# Patient Record
Sex: Male | Born: 1940 | Race: Black or African American | Hispanic: No | Marital: Married | Smoking: Never smoker
Health system: Southern US, Community
[De-identification: ages and names within clinical notes are randomized; demographics above are authoritative.]

## PROBLEM LIST (undated history)

## (undated) DIAGNOSIS — J45909 Unspecified asthma, uncomplicated: Secondary | ICD-10-CM

## (undated) DIAGNOSIS — K219 Gastro-esophageal reflux disease without esophagitis: Secondary | ICD-10-CM

## (undated) DIAGNOSIS — I10 Essential (primary) hypertension: Secondary | ICD-10-CM

## (undated) DIAGNOSIS — N401 Enlarged prostate with lower urinary tract symptoms: Secondary | ICD-10-CM

## (undated) DIAGNOSIS — D649 Anemia, unspecified: Secondary | ICD-10-CM

## (undated) DIAGNOSIS — R338 Other retention of urine: Secondary | ICD-10-CM

---
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
## 2004-06-09 ENCOUNTER — Ambulatory Visit: Payer: Self-pay | Admitting: Internal Medicine

## 2004-06-28 ENCOUNTER — Ambulatory Visit: Payer: Self-pay | Admitting: Internal Medicine

## 2004-07-06 ENCOUNTER — Ambulatory Visit: Payer: Self-pay

## 2004-08-01 ENCOUNTER — Ambulatory Visit: Payer: Self-pay | Admitting: Internal Medicine

## 2004-12-07 ENCOUNTER — Ambulatory Visit: Payer: Self-pay | Admitting: Internal Medicine

## 2004-12-22 ENCOUNTER — Ambulatory Visit: Payer: Self-pay | Admitting: Family Medicine

## 2005-06-07 ENCOUNTER — Ambulatory Visit: Payer: Self-pay | Admitting: Internal Medicine

## 2005-06-28 ENCOUNTER — Ambulatory Visit: Payer: Self-pay | Admitting: Internal Medicine

## 2005-08-23 ENCOUNTER — Ambulatory Visit: Payer: Self-pay | Admitting: Gastroenterology

## 2009-11-23 ENCOUNTER — Ambulatory Visit: Payer: Self-pay | Admitting: Specialist

## 2012-07-14 ENCOUNTER — Emergency Department: Payer: Self-pay | Admitting: Emergency Medicine

## 2012-07-14 LAB — COMPREHENSIVE METABOLIC PANEL
Albumin: 3.2 g/dL — ABNORMAL LOW (ref 3.4–5.0)
Anion Gap: 7 (ref 7–16)
Calcium, Total: 8.1 mg/dL — ABNORMAL LOW (ref 8.5–10.1)
Chloride: 103 mmol/L (ref 98–107)
Co2: 29 mmol/L (ref 21–32)
Glucose: 90 mg/dL (ref 65–99)
SGOT(AST): 41 U/L — ABNORMAL HIGH (ref 15–37)
SGPT (ALT): 65 U/L (ref 12–78)
Total Protein: 6.2 g/dL — ABNORMAL LOW (ref 6.4–8.2)

## 2012-07-14 LAB — CBC
HCT: 42.8 % (ref 40.0–52.0)
MCH: 29.8 pg (ref 26.0–34.0)
MCHC: 33.7 g/dL (ref 32.0–36.0)
MCV: 89 fL (ref 80–100)
RDW: 12.9 % (ref 11.5–14.5)

## 2013-02-09 ENCOUNTER — Emergency Department: Payer: Self-pay | Admitting: Emergency Medicine

## 2013-02-09 LAB — CBC
HCT: 43.6 % (ref 40.0–52.0)
HGB: 14.9 g/dL (ref 13.0–18.0)
MCHC: 34.3 g/dL (ref 32.0–36.0)
MCV: 88 fL (ref 80–100)
Platelet: 101 10*3/uL — ABNORMAL LOW (ref 150–440)
RBC: 4.97 10*6/uL (ref 4.40–5.90)
WBC: 5.5 10*3/uL (ref 3.8–10.6)

## 2013-02-09 LAB — URINALYSIS, COMPLETE
Glucose,UR: NEGATIVE mg/dL (ref 0–75)
Nitrite: NEGATIVE
Protein: 100
RBC,UR: 18 /HPF (ref 0–5)
Squamous Epithelial: 1

## 2013-02-09 LAB — COMPREHENSIVE METABOLIC PANEL
Albumin: 3.8 g/dL (ref 3.4–5.0)
Anion Gap: 7 (ref 7–16)
BUN: 9 mg/dL (ref 7–18)
Bilirubin,Total: 0.7 mg/dL (ref 0.2–1.0)
Calcium, Total: 9.4 mg/dL (ref 8.5–10.1)
Chloride: 95 mmol/L — ABNORMAL LOW (ref 98–107)
Co2: 30 mmol/L (ref 21–32)
Creatinine: 1.14 mg/dL (ref 0.60–1.30)
Glucose: 108 mg/dL — ABNORMAL HIGH (ref 65–99)
Osmolality: 264 (ref 275–301)
SGOT(AST): 27 U/L (ref 15–37)
Sodium: 132 mmol/L — ABNORMAL LOW (ref 136–145)

## 2013-02-09 LAB — TROPONIN I: Troponin-I: <0.02 ng/mL

## 2013-02-09 LAB — CK TOTAL AND CKMB (NOT AT ARMC)
CK, Total: 165 U/L (ref 35–232)
CK-MB: 1.3 ng/mL (ref 0.5–3.6)

## 2014-08-20 NOTE — Consult Note (Signed)
Brief Consult Note: Diagnosis: 1. Pre-syncope 2. UTI 3. Hypokalemia 4. HTN 5. BPH.   Patient was seen by consultant.   Consult note dictated.   Orders entered.   Comments: 74 yo male w/ hx of BPH, HTN, GERD who was recently diagnosed w/ UTI and started on oral Cipro presented to hospital w/ 2 pre-syncope episodes.   1. Pre-syncope - likely related to UTI/dehydration.   - no evidence of Neuro/cardiogenic syncope.  - pt. did not completely pass out. ECG shows no evidence of arrythmia.  ambulated and pt. hr is stable.  - orthostatic vital signs stable.  Will discharge home and encourage hydration.   2. UTI - was on Cipro and will switch to oral Ceftin for a few days  3. Hypokalemia - will place on oral potassium supplements.   4. HtN - cont. home meds.   5. BPH - cont. Avodart.  Pt. was apparently not taking it and advised him to resume it as hx CT scan was suggestive of prostate enlargement.   Pt. has appointment w/ his urologist later this month.   Will d/c home.  Follow up w/ PCP in 1-2 days.   Full Code  job # [id number].  Electronic Signatures: [place] SirenSainani, Vivek J (MD)  (Signed 13-Oct-14 17:00)  Authored: Brief Consult Note   Last Updated: 13-Oct-14 17:00 by [place] SirenSainani, Vivek J (MD)

## 2014-08-20 NOTE — Consult Note (Signed)
PATIENT NAME:  Glen Spencer, Glen Spencer MR#:  [id number] DATE OF BIRTH:  11-08-1940  DATE OF CONSULTATION:  02/09/2013  REFERRING PHYSICIAN: Dorothea GlassmanPaul Malinda, MD. PRIMARY CARE PHYSICIAN: Dr. Terance HartBronstein.  CONSULTING PHYSICIAN:  Ziyon Cedotal J. Cherlynn KaiserSainani, MD  CHIEF COMPLAINT: Presyncope.   HISTORY OF PRESENT ILLNESS: This is a 74 year old male who presents to the hospital with 2 presyncopal episodes that he had earlier today. The patient described the symptoms that he did not feel well, felt lightheaded and dizzy but never really truly lost consciousness. He did not have any chest pain, any shortness of breath, any numbness, tingling, or any other associated symptoms. The patient did say that he was having some blood-tinged urine for the past few days  along with frequency. He went to the urgent care and was diagnosed with a urinary tract infection and started on oral ciprofloxacin. He has been taking it for the past 2 to 3 days.   He thought his symptoms were improving although yesterday he was still having significant frequency. This morning when he woke up, he just did not feel right, felt lightheaded and dizzy  but never lost any consciousness. He therefore came to the ER for further evaluation.   REVIEW OF SYSTEMS:  CONSTITUTIONAL: No documented fever. No weight gain or weight loss.  EYES: No blurry or double vision.  ENT: No tinnitus. No postnasal drip. No redness of the oropharynx.  RESPIRATORY: No cough, no wheeze, hemoptysis, no dyspnea.  CARDIOVASCULAR: No chest pain. No orthopnea. No palpitations. No true syncope.  GASTROINTESTINAL: No nausea. No vomiting. No diarrhea. No abdominal pain. No melena or hematochezia.  GENITOURINARY: No dysuria. Positive hematuria. Positive frequency.  ENDOCRINE: No polyuria or nocturia. No heat or cold intolerance.  HEMATOLOGIC: No anemia, no bruising, no bleeding.  INTEGUMENTARY: No rashes. No lesions.  MUSCULOSKELETAL: No arthritis. No swelling. No gout.  NEUROLOGIC: No  numbness or tingling. No ataxia. No seizure activity.  PSYCHIATRIC: No anxiety. No insomnia. No ADD.   PAST MEDICAL HISTORY: Consistent with hypertension, BPH, GERD.   ALLERGIES: No known drug allergies.   SOCIAL HISTORY: No smoking. No alcohol abuse. No illicit drug abuse. Lives at home with his wife.   FAMILY HISTORY: No significant family history of coronary artery disease. Mother has diabetes. Father died of a cancer of unknown type.   CURRENT MEDICATIONS: As follows: Avodart 0.5 mg daily, hydrochlorothiazide/losartan 25/160, one tab daily, omeprazole 40 mg daily, and simvastatin 40 mg at bedtime.   PHYSICAL EXAMINATION:  VITAL SIGNS: Afebrile. Pulse 74, respirations 20, blood pressure 155/84, sats 98% on room air.  GENERAL: A pleasant-appearing male in no apparent distress.  HEENT: Atraumatic, normocephalic. Extraocular muscles are intact. Pupils equal, reactive to light. Sclerae anicteric. No conjunctival injection. No oropharyngeal erythema.  NECK: Supple. There is no jugular venous distention. No bruits, no lymphadenopathy or thyromegaly.  HEART: Regular rate and rhythm. No murmurs. No rubs, no clicks.  LUNGS: Clear to auscultation bilaterally. No rales. No rhonchi, no wheezes.  ABDOMEN: Soft, flat, nontender, nondistended. Has good bowel sounds. No hepatosplenomegaly appreciated.  EXTREMITIES: No evidence of any cyanosis, clubbing, or peripheral edema. Has +2 pedal and radial pulses bilaterally.  NEUROLOGIC: Alert, awake, and oriented x3 with no focal motor or sensory deficits appreciated bilaterally.  SKIN: Moist and warm with no rashes appreciated.  LYMPHATIC: No cervical or axillary lymphadenopathy.   LABORATORY DATA: Serum glucose of 108, BUN 9, creatinine 1.14, sodium 132, potassium 2.9, chloride 95, bicarb 30. LFTs are within normal limits. Troponin  less than 0.02. White cell count 5.5, hemoglobin 14.9, hematocrit 43.6, platelet count of 101. Urinalysis does show trace  leukocyte esterase, 18 red cells, 32 white cells, with 1+ bacteria.   The patient did have a CT of the head done without contrast which showed no acute intracranial abnormality.   The patient also had a CT abdomen and pelvis done which showed marked enlargement of the prostate gland with irregular impression upon the urinary bladder base. No evidence of hydronephrosis. No hydroureter. Small hiatal hernia.   ASSESSMENT AND PLAN: This is a 74 year old male with a history of benign prostatic hypertrophy, hypertension, gastroesophageal reflux disease, who was recently diagnosed with a urinary tract infection and started on oral Cipro, who presents to the hospital with 2 presyncopal episodes.   1.  Presyncope. I suspect this is probably related to the underlying urinary tract infection and dehydration. The patient had no evidence of acute neurocardiogenic syncope as he never really lost consciousness. His EKG shows no evidence of arrhythmia. The patient was ambulated and his heart rate remained stable with no evidence of bradycardia. His orthostatic vital signs are also stable. For now, we will discharge the patient home and encourage oral hydration and follow up with his primary care physician.  2.  Urinary tract infection. The patient was on Cipro. He has taken it for 2 days with mild improvement. I will switch his oral antibiotics to Ceftin for the next 7 days. I did look at his urinalysis results from urgent care. It is abnormal but they did not sent of a urine culture.  3.  Hypokalemia. This is likely secondary to the patient being on hydrochlorothiazide. I will go ahead and place him on some oral potassium supplements for the next few days.  4.  Hypertension. The patient will continue his maintenance home medications including valsartan and hydrochlorothiazide.  5.  Benign prostatic hypertrophy. The patient's CT abdomen did show that the patient has an enlarged prostate. The patient apparently is on  Avodart but has not been compliant with it. I explained to him that he needs to be compliant and continue taking his Avodart, which he agrees with. The patient does have a follow-up with his urologist later this month. His last PSA as per him was within normal limits.   DISPOSITION: The patient will be discharged home with close follow-up with his primary care physician. He is in agreement with this plan.   TIME SPENT WITH THE CONSULT: 50 minutes.     ____________________________ Rolly Pancake. Cherlynn Kaiser, MD vjs:np D: 02/09/2013 17:00:08 ET T: 02/09/2013 18:27:40 ET JOB#: [id number]  cc: Rolly Pancake. Cherlynn Kaiser, MD, <Dictator> Houston Siren MD ELECTRONICALLY SIGNED 02/10/2013 11:17

## 2014-10-21 ENCOUNTER — Ambulatory Visit (INDEPENDENT_AMBULATORY_CARE_PROVIDER_SITE_OTHER): Payer: Medicare Other | Admitting: Podiatry

## 2014-10-21 VITALS — BP 125/80 | HR 63 | Resp 12

## 2014-10-21 DIAGNOSIS — L600 Ingrowing nail: Secondary | ICD-10-CM

## 2014-10-21 NOTE — Progress Notes (Signed)
   Subjective:    Patient ID: Glen Spencer, male    DOB: Jan 04, 1941, 74 y.o.   MRN: [id number]  HPI Patient presents ingrown L great toenail   Review of Systems  Skin: Positive for color change.  All other systems reviewed and are negative.      Objective:   Physical Exam        Assessment & Plan:

## 2014-10-21 NOTE — Patient Instructions (Signed)

## 2014-10-22 NOTE — Progress Notes (Signed)
Subjective:     Patient ID: Glen Spencer, male   DOB: 06/06/1940, 74 y.o.   MRN: [id number]  HPI patient presents stating I'm an ingrown toenail on my left big toe that's been bothering me for the last few months and I been trying to trim it myself without relief   Review of Systems  All other systems reviewed and are negative.      Objective:   Physical Exam  Constitutional: He is oriented to person, place, and time.  Cardiovascular: Intact distal pulses.   Musculoskeletal: Normal range of motion.  Neurological: He is oriented to person, place, and time.  Skin: Skin is warm.  Nursing note and vitals reviewed.  neurovascular status intact muscle strength is intact with range of motion within normal limits. Patient's found to have an incurvated left hallux medial border that's painful when pressed and makes shoe gear difficult and it is inflamed with no drainage noted     Assessment:     ingrown toenail left hallux medial border with no other pathology noted    Plan:     Reviewed condition and recommended correction. Explained procedure to patient and risk and he wants procedure. Today I infiltrated 60 mg I can Marcaine mixture remove the medial border exposed matrix and apply chemical phenol 3 applications 30 seconds followed by alcohol lavage and sterile dressing. Gave instructions on soaks

## 2014-10-26 ENCOUNTER — Telehealth: Payer: Self-pay | Admitting: *Deleted

## 2014-10-26 NOTE — Telephone Encounter (Signed)
Pt asked how long to soak the toes after the procedure.  I informed pt [id number] to soak the area until dry hard scabs formed without drainage about 4-6 weeks.  Pt states understanding.

## 2015-08-05 ENCOUNTER — Encounter: Payer: Self-pay | Admitting: *Deleted

## 2015-08-08 ENCOUNTER — Ambulatory Visit: Payer: Medicare Other | Admitting: Certified Registered"

## 2015-08-08 ENCOUNTER — Encounter
Admission: RE | Disposition: A | Discharge status: Home / Self Care | Payer: Self-pay | Source: Ambulatory Visit | Attending: Gastroenterology

## 2015-08-08 ENCOUNTER — Ambulatory Visit
Admission: RE | Admit: 2015-08-08 | Discharge: 2015-08-08 | Disposition: A | Discharge status: Home / Self Care | Payer: Medicare Other | Source: Ambulatory Visit | Attending: Gastroenterology | Admitting: Gastroenterology

## 2015-08-08 ENCOUNTER — Encounter: Payer: Self-pay | Admitting: *Deleted

## 2015-08-08 DIAGNOSIS — I10 Essential (primary) hypertension: Secondary | ICD-10-CM | POA: Insufficient documentation

## 2015-08-08 DIAGNOSIS — K210 Gastro-esophageal reflux disease with esophagitis: Secondary | ICD-10-CM | POA: Insufficient documentation

## 2015-08-08 DIAGNOSIS — Z1211 Encounter for screening for malignant neoplasm of colon: Secondary | ICD-10-CM | POA: Diagnosis not present

## 2015-08-08 DIAGNOSIS — K219 Gastro-esophageal reflux disease without esophagitis: Secondary | ICD-10-CM | POA: Diagnosis present

## 2015-08-08 HISTORY — PX: ESOPHAGOGASTRODUODENOSCOPY (EGD) WITH PROPOFOL: SHX5813

## 2015-08-08 HISTORY — PX: COLONOSCOPY WITH PROPOFOL: SHX5780

## 2015-08-08 HISTORY — DX: Anemia, unspecified: D64.9

## 2015-08-08 HISTORY — DX: Essential (primary) hypertension: I10

## 2015-08-08 SURGERY — COLONOSCOPY WITH PROPOFOL
Anesthesia: General

## 2015-08-08 MED ORDER — SODIUM CHLORIDE 0.9 % IV SOLN
INTRAVENOUS | Status: DC
Start: 2015-08-08 — End: 2015-08-08

## 2015-08-08 MED ORDER — SODIUM CHLORIDE 0.9 % IV SOLN
INTRAVENOUS | Status: DC
  Administered 2015-08-08: 08:00:00 via INTRAVENOUS

## 2015-08-08 MED ORDER — LIDOCAINE HCL (PF) 2 % IJ SOLN
INTRAMUSCULAR | Status: DC | PRN
  Administered 2015-08-08: 100 mg via INTRADERMAL

## 2015-08-08 MED ORDER — PROPOFOL 10 MG/ML IV BOLUS
INTRAVENOUS | Status: DC | PRN
  Administered 2015-08-08: 70 mg via INTRAVENOUS

## 2015-08-08 MED ORDER — PROPOFOL 500 MG/50ML IV EMUL
INTRAVENOUS | Status: DC | PRN
  Administered 2015-08-08: 150 ug/kg/min via INTRAVENOUS

## 2015-08-08 NOTE — Transfer of Care (Signed)
Immediate Anesthesia Transfer of Care Note  Patient: Tina GriffithsRaymond E Fossum  Procedure(s) Performed: Procedure(s): COLONOSCOPY WITH PROPOFOL (N/A) ESOPHAGOGASTRODUODENOSCOPY (EGD) WITH PROPOFOL (N/A)  Patient Location: PACU  Anesthesia Type:General  Level of Consciousness: responds to stimulation, sleeping  Airway & Oxygen Therapy: Patient Spontanous Breathing and Patient connected to nasal cannula oxygen  Post-op Assessment: Report given to RN and Post -op Vital signs reviewed and stable  Post vital signs: Reviewed and stable  Last Vitals:  Filed Vitals:   08/08/15 0757 08/08/15 0854  BP: 136/83 117/70  Pulse: 66 54  Temp: 36.1 C   Resp: 14 19    Complications: No apparent anesthesia complications

## 2015-08-08 NOTE — Op Note (Signed)
[facility] Gastroenterology Patient Name: Glen Spencer Procedure Date: 08/08/2015 8:21 AM MRN: [id number] Account #: [id number] Date of Birth: 12-21-1940 Admit Type: Outpatient Age: 75 Room: [facility] ENDO ROOM 4 Gender: Male Note Status: Finalized Procedure:            Upper GI endoscopy Indications:          Suspected esophageal reflux Providers:            Ezzard Standing. Bluford Kaufmann, MD Referring MD:         Sallye Lat Md, MD (Referring MD) Medicines:            Monitored Anesthesia Care Complications:        No immediate complications. Procedure:            Pre-Anesthesia Assessment:                       - Prior to the procedure, a History and Physical was                        performed, and patient medications, allergies and                        sensitivities were reviewed. The patient's tolerance of                        previous anesthesia was reviewed.                       - The risks and benefits of the procedure and the                        sedation options and risks were discussed with the                        patient. All questions were answered and informed                        consent was obtained.                       - After reviewing the risks and benefits, the patient                        was deemed in satisfactory condition to undergo the                        procedure.                       After obtaining informed consent, the endoscope was                        passed under direct vision. Throughout the procedure,                        the patient's blood pressure, pulse, and oxygen                        saturations were monitored continuously. The Endoscope  was introduced through the mouth, and advanced to the                        second part of duodenum. The upper GI endoscopy was                        accomplished without difficulty. The patient tolerated                        the procedure well. Findings:     LA Grade A (one or more mucosal breaks less than 5 mm, not extending       between tops of 2 mucosal folds) esophagitis was found at the       gastroesophageal junction. Biopsies were taken with a cold forceps for       histology.      The exam was otherwise without abnormality.      Localized mildly erythematous mucosa without bleeding was found in the       gastric antrum.      The exam was otherwise without abnormality.      The examined duodenum was normal. Impression:           - LA Grade A reflux esophagitis. Biopsied.                       - The examination was otherwise normal.                       - Erythematous mucosa in the antrum.                       - The examination was otherwise normal.                       - Normal examined duodenum. Recommendation:       - Discharge patient to home.                       - Await pathology results.                       - Observe patient's clinical course. Procedure Code(s):    --- Professional ---                       [phone], Esophagogastroduodenoscopy, flexible, transoral;                        with biopsy, single or multiple Diagnosis Code(s):    --- Professional ---                       K21.0, Gastro-esophageal reflux disease with esophagitis                       K31.89, Other diseases of stomach and duodenum CPT copyright 2016 American Medical Association. All rights reserved. The codes documented in this report are preliminary and upon coder review may  be revised to meet current compliance requirements. [name], MD 08/08/2015 8:34:51 AM This report has been signed electronically. Number of Addenda: 0 Note Initiated On: 08/08/2015 8:21 AM      [facility]

## 2015-08-08 NOTE — Anesthesia Preprocedure Evaluation (Signed)
Anesthesia Evaluation  Patient identified by MRN, date of birth, ID band Patient awake    Reviewed: Allergy & Precautions, NPO status , Patient's Chart, lab work & pertinent test results  Airway Mallampati: II  TM Distance: >3 FB Neck ROM: Limited    Dental  (+) Teeth Intact   Pulmonary    Pulmonary exam normal        Cardiovascular hypertension, Normal cardiovascular exam     Neuro/Psych    GI/Hepatic GERD  Medicated and Controlled,  Endo/Other    Renal/GU      Musculoskeletal   Abdominal Normal abdominal exam  (+)   Peds  Hematology   Anesthesia Other Findings   Reproductive/Obstetrics                             Anesthesia Physical Anesthesia Plan  ASA: II  Anesthesia Plan: General   Post-op Pain Management:    Induction: Intravenous  Airway Management Planned: Nasal Cannula  Additional Equipment:   Intra-op Plan:   Post-operative Plan:   Informed Consent: I have reviewed the patients History and Physical, chart, labs and discussed the procedure including the risks, benefits and alternatives for the proposed anesthesia with the patient or authorized representative who has indicated his/her understanding and acceptance.     Plan Discussed with: CRNA  Anesthesia Plan Comments:         Anesthesia Quick Evaluation

## 2015-08-08 NOTE — H&P (Signed)
    Primary Care Physician:  Dorothey BasemanAVID BRONSTEIN, MD Primary Gastroenterologist:  Dr. Bluford Kaufmannh  Pre-Procedure History & Physical: HPI:  Glen Spencer is a 75 y.o. male is here for an EGD/colonoscopy.   Past Medical History  Diagnosis Date  . Hypertension   . Anemia     History reviewed. No pertinent past surgical history.  Prior to Admission medications   Medication Sig Start Date End Date Taking? Authorizing Provider  valsartan (DIOVAN) 160 MG tablet Take 160 mg by mouth daily.   Yes Historical Provider, MD    Allergies as of 08/02/2015  . (Not on File)    History reviewed. No pertinent family history.  Social History   Social History  . Marital Status: Married    Spouse Name: N/A  . Number of Children: N/A  . Years of Education: N/A   Occupational History  . Not on file.   Social History Main Topics  . Smoking status: Never Smoker   . Smokeless tobacco: Never Used  . Alcohol Use: No  . Drug Use: No  . Sexual Activity: Not on file   Other Topics Concern  . Not on file   Social History Narrative    Review of Systems: See HPI, otherwise negative ROS  Physical Exam: BP 136/83 mmHg  Pulse 66  Temp(Src) 97 F (36.1 C) (Tympanic)  Resp 14  Ht 5\' 11"  (1.803 m)  Wt 97.07 kg (214 lb)  BMI 29.86 kg/m2  SpO2 100% General:   Alert,  pleasant and cooperative in NAD Head:  Normocephalic and atraumatic. Neck:  Supple; no masses or thyromegaly. Lungs:  Clear throughout to auscultation.    Heart:  Regular rate and rhythm. Abdomen:  Soft, nontender and nondistended. Normal bowel sounds, without guarding, and without rebound.   Neurologic:  Alert and  oriented x4;  grossly normal neurologically.  Impression/Plan: Glen Spencer is here for an EGD/colonoscopy to be performed for screening, GERD Risks, benefits, limitations, and alternatives regarding  EGD/colonosopy have been reviewed with the patient.  Questions have been answered.  All parties agreeable.   Ritik Stavola,  Ezzard StandingPAUL Y, MD  08/08/2015, 8:04 AM

## 2015-08-08 NOTE — Anesthesia Postprocedure Evaluation (Signed)
Anesthesia Post Note  Patient: Glen Spencer  Procedure(s) Performed: Procedure(s) (LRB): COLONOSCOPY WITH PROPOFOL (N/A) ESOPHAGOGASTRODUODENOSCOPY (EGD) WITH PROPOFOL (N/A)  Patient location during evaluation: Endoscopy Anesthesia Type: General Level of consciousness: awake Pain management: pain level controlled Vital Signs Assessment: post-procedure vital signs reviewed and stable Respiratory status: spontaneous breathing Cardiovascular status: blood pressure returned to baseline Postop Assessment: no headache Anesthetic complications: no    Last Vitals:  Filed Vitals:   08/08/15 0910 08/08/15 0916  BP: 114/77 143/83  Pulse: 53 56  Temp:    Resp: 18 15    Last Pain: There were no vitals filed for this visit.               Chrissa Meetze M

## 2015-08-08 NOTE — Op Note (Signed)
[facility] Gastroenterology Patient Name: Glen Spencer Chandra Procedure Date: 08/08/2015 8:20 AM MRN: [id number] Account #: [id number] Date of Birth: Dec 09, 1940 Admit Type: Outpatient Age: 75 [facility] 4 Gender: Male Note Status: Finalized Procedure:            Colonoscopy Indications:          Screening for colorectal malignant neoplasm Providers:            Ezzard StandingPaul Y. Bluford Kaufmannh, MD Referring MD:         Sallye LatNo Local Md, MD (Referring MD) Medicines:            Monitored Anesthesia Care Complications:        No immediate complications. Procedure:            Pre-Anesthesia Assessment:                       - Prior to the procedure, a History and Physical was                        performed, and patient medications, allergies and                        sensitivities were reviewed. The patient's tolerance of                        previous anesthesia was reviewed.                       - The risks and benefits of the procedure and the                        sedation options and risks were discussed with the                        patient. All questions were answered and informed                        consent was obtained.                       - After reviewing the risks and benefits, the patient                        was deemed in satisfactory condition to undergo the                        procedure.                       After obtaining informed consent, the colonoscope was                        passed under direct vision. Throughout the procedure,                        the patient's blood pressure, pulse, and oxygen                        saturations were monitored continuously. The  Colonoscope was introduced through the anus and                        advanced to the the cecum, identified by appendiceal                        orifice and ileocecal valve. The colonoscopy was                        performed without difficulty. The patient  tolerated the                        procedure well. The quality of the bowel preparation                        was poor. Findings:      The colon (entire examined portion) appeared normal. Impression:           - Preparation of the colon was poor.                       - The entire examined colon is normal.                       - No specimens collected. Recommendation:       - Discharge patient to home.                       - The findings and recommendations were discussed with                        the patient. Procedure Code(s):    --- Professional ---                       [phone], Colonoscopy, flexible; diagnostic, including                        collection of specimen(s) by brushing or washing, when                        performed (separate procedure) Diagnosis Code(s):    --- Professional ---                       Z12.11, Encounter for screening for malignant neoplasm                        of colon CPT copyright 2016 American Medical Association. All rights reserved. The codes documented in this report are preliminary and upon coder review may  be revised to meet current compliance requirements. Wallace Cullens, MD 08/08/2015 8:52:22 AM This report has been signed electronically. Number of Addenda: 0 Note Initiated On: 08/08/2015 8:20 AM Scope Withdrawal Time: 0 hours 9 minutes 37 seconds  Total Procedure Duration: 0 hours 14 minutes 5 seconds       [facility]

## 2015-08-09 ENCOUNTER — Encounter: Payer: Self-pay | Admitting: Gastroenterology

## 2015-08-09 LAB — SURGICAL PATHOLOGY

## 2016-02-12 ENCOUNTER — Emergency Department: Payer: Medicare Other

## 2016-02-12 ENCOUNTER — Inpatient Hospital Stay
Admission: EM | Admit: 2016-02-12 | Discharge: 2016-02-13 | DRG: 683 | Disposition: A | Discharge status: Home / Self Care | Payer: Medicare Other | Attending: Internal Medicine | Admitting: Internal Medicine

## 2016-02-12 DIAGNOSIS — D696 Thrombocytopenia, unspecified: Secondary | ICD-10-CM

## 2016-02-12 DIAGNOSIS — N1330 Unspecified hydronephrosis: Secondary | ICD-10-CM | POA: Diagnosis present

## 2016-02-12 DIAGNOSIS — Z79899 Other long term (current) drug therapy: Secondary | ICD-10-CM | POA: Diagnosis not present

## 2016-02-12 DIAGNOSIS — K219 Gastro-esophageal reflux disease without esophagitis: Secondary | ICD-10-CM | POA: Diagnosis present

## 2016-02-12 DIAGNOSIS — E876 Hypokalemia: Secondary | ICD-10-CM | POA: Diagnosis present

## 2016-02-12 DIAGNOSIS — I10 Essential (primary) hypertension: Secondary | ICD-10-CM

## 2016-02-12 DIAGNOSIS — K5900 Constipation, unspecified: Secondary | ICD-10-CM | POA: Diagnosis present

## 2016-02-12 DIAGNOSIS — J9811 Atelectasis: Secondary | ICD-10-CM | POA: Diagnosis present

## 2016-02-12 DIAGNOSIS — N320 Bladder-neck obstruction: Secondary | ICD-10-CM | POA: Diagnosis present

## 2016-02-12 DIAGNOSIS — R109 Unspecified abdominal pain: Secondary | ICD-10-CM | POA: Diagnosis not present

## 2016-02-12 DIAGNOSIS — R1030 Lower abdominal pain, unspecified: Secondary | ICD-10-CM | POA: Diagnosis present

## 2016-02-12 DIAGNOSIS — N138 Other obstructive and reflux uropathy: Secondary | ICD-10-CM | POA: Diagnosis present

## 2016-02-12 DIAGNOSIS — N401 Enlarged prostate with lower urinary tract symptoms: Secondary | ICD-10-CM | POA: Diagnosis present

## 2016-02-12 DIAGNOSIS — R32 Unspecified urinary incontinence: Secondary | ICD-10-CM | POA: Diagnosis present

## 2016-02-12 DIAGNOSIS — N19 Unspecified kidney failure: Secondary | ICD-10-CM

## 2016-02-12 DIAGNOSIS — N179 Acute kidney failure, unspecified: Principal | ICD-10-CM | POA: Diagnosis present

## 2016-02-12 DIAGNOSIS — R310 Gross hematuria: Secondary | ICD-10-CM | POA: Diagnosis present

## 2016-02-12 DIAGNOSIS — N3289 Other specified disorders of bladder: Secondary | ICD-10-CM | POA: Diagnosis present

## 2016-02-12 DIAGNOSIS — R339 Retention of urine, unspecified: Secondary | ICD-10-CM

## 2016-02-12 LAB — COMPREHENSIVE METABOLIC PANEL
ALBUMIN: 3.9 g/dL (ref 3.5–5.0)
ALK PHOS: 60 U/L (ref 38–126)
ALT: 22 U/L (ref 17–63)
ANION GAP: 8 (ref 5–15)
AST: 32 U/L (ref 15–41)
BUN: 31 mg/dL — ABNORMAL HIGH (ref 6–20)
CALCIUM: 9 mg/dL (ref 8.9–10.3)
CO2: 27 mmol/L (ref 22–32)
Chloride: 100 mmol/L — ABNORMAL LOW (ref 101–111)
Creatinine, Ser: 2.87 mg/dL — ABNORMAL HIGH (ref 0.61–1.24)
GFR calc Af Amer: 24 mL/min — ABNORMAL LOW (ref >60)
GFR calc non Af Amer: 20 mL/min — ABNORMAL LOW (ref >60)
GLUCOSE: 82 mg/dL (ref 65–99)
Potassium: 3.2 mmol/L — ABNORMAL LOW (ref 3.5–5.1)
SODIUM: 135 mmol/L (ref 135–145)
Total Bilirubin: 1.1 mg/dL (ref 0.3–1.2)
Total Protein: 7 g/dL (ref 6.5–8.1)

## 2016-02-12 LAB — CBC WITH DIFFERENTIAL/PLATELET
Basophils Absolute: 0 10*3/uL (ref 0–0.1)
Basophils Relative: 0 %
EOS PCT: 2 %
Eosinophils Absolute: 0.1 10*3/uL (ref 0–0.7)
HEMATOCRIT: 41.5 % (ref 40.0–52.0)
HEMOGLOBIN: 14.4 g/dL (ref 13.0–18.0)
LYMPHS ABS: 1 10*3/uL (ref 1.0–3.6)
LYMPHS PCT: 14 %
MCH: 30.7 pg (ref 26.0–34.0)
MCHC: 34.8 g/dL (ref 32.0–36.0)
MCV: 88.4 fL (ref 80.0–100.0)
Monocytes Absolute: 0.8 10*3/uL (ref 0.2–1.0)
Monocytes Relative: 11 %
NEUTROS ABS: 5.2 10*3/uL (ref 1.4–6.5)
NEUTROS PCT: 73 %
Platelets: 115 10*3/uL — ABNORMAL LOW (ref 150–440)
RBC: 4.7 MIL/uL (ref 4.40–5.90)
RDW: 13.1 % (ref 11.5–14.5)
WBC: 7.1 10*3/uL (ref 3.8–10.6)

## 2016-02-12 LAB — URINALYSIS COMPLETE WITH MICROSCOPIC (ARMC ONLY)
BACTERIA UA: NONE SEEN
Bilirubin Urine: NEGATIVE
GLUCOSE, UA: NEGATIVE mg/dL
HGB URINE DIPSTICK: NEGATIVE
Ketones, ur: NEGATIVE mg/dL
Leukocytes, UA: NEGATIVE
Nitrite: NEGATIVE
Protein, ur: NEGATIVE mg/dL
Specific Gravity, Urine: 1.004 — ABNORMAL LOW (ref 1.005–1.030)
pH: 6 (ref 5.0–8.0)

## 2016-02-12 LAB — TROPONIN I: Troponin I: <0.03 ng/mL (ref <0.03)

## 2016-02-12 LAB — BRAIN NATRIURETIC PEPTIDE: B Natriuretic Peptide: 42 pg/mL (ref 0.0–100.0)

## 2016-02-12 LAB — PSA: PSA: 3.52 ng/mL (ref 0.00–4.00)

## 2016-02-12 LAB — MAGNESIUM: MAGNESIUM: 2.6 mg/dL — AB (ref 1.7–2.4)

## 2016-02-12 LAB — FIBRIN DERIVATIVES D-DIMER (ARMC ONLY): Fibrin derivatives D-dimer (ARMC): 627 — ABNORMAL HIGH (ref 0–499)

## 2016-02-12 MED ORDER — PANTOPRAZOLE SODIUM 40 MG PO TBEC
40.0000 mg | DELAYED_RELEASE_TABLET | Freq: Two times a day (BID) | ORAL | Status: DC
  Administered 2016-02-12 – 2016-02-13 (×2): 40 mg via ORAL
  Filled 2016-02-12 (×2): qty 1

## 2016-02-12 MED ORDER — DOCUSATE SODIUM 100 MG PO CAPS
100.0000 mg | ORAL_CAPSULE | Freq: Two times a day (BID) | ORAL | Status: DC
Start: 2016-02-12 — End: 2016-02-13
  Administered 2016-02-12 – 2016-02-13 (×2): 100 mg via ORAL
  Filled 2016-02-12 (×2): qty 1

## 2016-02-12 MED ORDER — POTASSIUM CHLORIDE CRYS ER 20 MEQ PO TBCR
40.0000 meq | EXTENDED_RELEASE_TABLET | Freq: Once | ORAL | Status: AC
  Administered 2016-02-12: 40 meq via ORAL
  Filled 2016-02-12: qty 2

## 2016-02-12 MED ORDER — ONDANSETRON HCL 4 MG/2ML IJ SOLN: 4.0000 mg | Freq: Four times a day (QID) | INTRAMUSCULAR | Status: DC | PRN

## 2016-02-12 MED ORDER — SODIUM CHLORIDE 0.9 % IV SOLN
INTRAVENOUS | Status: DC
  Administered 2016-02-12 – 2016-02-13 (×2): via INTRAVENOUS

## 2016-02-12 MED ORDER — AMLODIPINE BESYLATE 5 MG PO TABS
5.0000 mg | ORAL_TABLET | Freq: Two times a day (BID) | ORAL | Status: DC
  Administered 2016-02-12 – 2016-02-13 (×2): 5 mg via ORAL
  Filled 2016-02-12 (×2): qty 1

## 2016-02-12 MED ORDER — LIDOCAINE HCL 2 % EX GEL
CUTANEOUS | Status: AC
  Administered 2016-02-12: 14:00:00
  Filled 2016-02-12: qty 10

## 2016-02-12 MED ORDER — TAMSULOSIN HCL 0.4 MG PO CAPS
0.4000 mg | ORAL_CAPSULE | Freq: Every day | ORAL | Status: DC
  Administered 2016-02-12 – 2016-02-13 (×2): 0.4 mg via ORAL
  Filled 2016-02-12 (×2): qty 1

## 2016-02-12 MED ORDER — ACETAMINOPHEN 325 MG PO TABS: 650.0000 mg | ORAL_TABLET | Freq: Four times a day (QID) | ORAL | Status: DC | PRN

## 2016-02-12 MED ORDER — ACETAMINOPHEN 650 MG RE SUPP: 650.0000 mg | Freq: Four times a day (QID) | RECTAL | Status: DC | PRN

## 2016-02-12 MED ORDER — FINASTERIDE 5 MG PO TABS
5.0000 mg | ORAL_TABLET | Freq: Every day | ORAL | Status: DC
  Administered 2016-02-12 – 2016-02-13 (×2): 5 mg via ORAL
  Filled 2016-02-12 (×3): qty 1

## 2016-02-12 MED ORDER — IOPAMIDOL (ISOVUE-300) INJECTION 61%
30.0000 mL | Freq: Once | INTRAVENOUS | Status: AC | PRN
  Administered 2016-02-12: 30 mL via ORAL

## 2016-02-12 MED ORDER — SENNA 8.6 MG PO TABS
1.0000 | ORAL_TABLET | Freq: Two times a day (BID) | ORAL | Status: DC
  Administered 2016-02-12 – 2016-02-13 (×2): 8.6 mg via ORAL
  Filled 2016-02-12 (×2): qty 1

## 2016-02-12 MED ORDER — SODIUM CHLORIDE 0.9% FLUSH
3.0000 mL | Freq: Two times a day (BID) | INTRAVENOUS | Status: DC
  Administered 2016-02-12 – 2016-02-13 (×3): 3 mL via INTRAVENOUS

## 2016-02-12 MED ORDER — HEPARIN SODIUM (PORCINE) 5000 UNIT/ML IJ SOLN
5000.0000 [IU] | Freq: Three times a day (TID) | INTRAMUSCULAR | Status: DC
Start: 2016-02-12 — End: 2016-02-13
  Administered 2016-02-12 – 2016-02-13 (×2): 5000 [IU] via SUBCUTANEOUS
  Filled 2016-02-12 (×2): qty 1

## 2016-02-12 MED ORDER — TAMSULOSIN HCL 0.4 MG PO CAPS: 0.4000 mg | ORAL_CAPSULE | Freq: Every day | ORAL | Status: DC

## 2016-02-12 MED ORDER — ONDANSETRON HCL 4 MG PO TABS: 4.0000 mg | ORAL_TABLET | Freq: Four times a day (QID) | ORAL | Status: DC | PRN

## 2016-02-12 NOTE — ED Notes (Signed)
Foley clamped per order by Dr. Darnelle CatalanMalinda due to patient urine output of 2850cc.

## 2016-02-12 NOTE — ED Notes (Signed)
This RN introduced self to patient and his wife. Pt returned from US and CT at bedside to give patient contrast. Pt states understanding that he is supposed to drink 2nd bottle of contrast at 1215 per CT personnel. Pt is in NAD, placed BP cuff and pulse oximeter on patient. Pt c/o lower abdominal pain 7/10, describes as pressure. Pt is alert and oriented, requests food, states understanding as to why he cannot have anything to eat at this time. Will continue to monitor for further patient needs.

## 2016-02-12 NOTE — H&P (Addendum)
[facility] [place] at [facility]   PATIENT NAME: Glen Spencer    MR#:  [id number]  DATE OF BIRTH:  Nov 02, 1940  DATE OF ADMISSION:  02/12/2016  PRIMARY CARE PHYSICIAN: Dorothey BasemanAVID BRONSTEIN, MD   REQUESTING/REFERRING PHYSICIAN:   CHIEF COMPLAINT:   Chief Complaint  Patient presents with  . Flank Pain    HISTORY OF PRESENT ILLNESS: Glen Spencer  is a 75 y.o. male with a known history of Essential hypertension, anemia, who presents to the hospital with complaints of right flank pain, lower abdominal distention, bloating, dysuria, increased frequency of urination, urinary incontinence, constipation. Patient tells me that she's been going on for the past few months, he has been on antibiotic therapy for dysuria, suspected urine showed infection few times. His symptoms seem to be worsening with worsening abdominal distention, constipation. Patient does not get enough sleep due to increased frequency of urination. He admits of incontinence, dribbling after he urinates. He denies any fevers or chills. On arrival to emergency room, he was noted to have elevated blood pressure to 170s, renal failure with creatinine level of close to 3. CT scan of abdomen revealed severe bladder distention, bilateral hydronephrosis and hydroureter. Last bowel movement was about one half weeks ago. Patient also admitted of lower extremity swelling, especially right lower extremity. Hospitalist services were contacted for admission. The Foley catheter was placed to the emergency room, yielding 2.5 L so far and running.  PAST MEDICAL HISTORY:   Past Medical History:  Diagnosis Date  . Anemia   . Hypertension     PAST SURGICAL HISTORY: Past Surgical History:  Procedure Laterality Date  . COLONOSCOPY WITH PROPOFOL N/A 08/08/2015   Procedure: COLONOSCOPY WITH PROPOFOL;  Surgeon: Wallace CullensPaul Y Oh, MD;  [facility];  Service: Gastroenterology;  Laterality: N/A;  .  ESOPHAGOGASTRODUODENOSCOPY (EGD) WITH PROPOFOL N/A 08/08/2015   Procedure: ESOPHAGOGASTRODUODENOSCOPY (EGD) WITH PROPOFOL;  Surgeon: Wallace CullensPaul Y Oh, MD;  [facility];  Service: Gastroenterology;  Laterality: N/A;    SOCIAL HISTORY:  Social History  Substance Use Topics  . Smoking status: Never Smoker  . Smokeless tobacco: Never Used  . Alcohol use No    FAMILY HISTORY: No family history on file.  DRUG ALLERGIES: No Known Allergies  Review of Systems  Constitutional: Positive for malaise/fatigue.  HENT: Positive for congestion.   Respiratory: Positive for cough.   Gastrointestinal: Positive for constipation.  Genitourinary: Positive for dysuria, flank pain and frequency.  Neurological: Positive for headaches.    MEDICATIONS AT HOME:  Prior to Admission medications   Medication Sig Start Date End Date Taking? Authorizing Provider  valsartan (DIOVAN) 160 MG tablet Take 160 mg by mouth daily.    Historical Provider, MD      PHYSICAL EXAMINATION:   VITAL SIGNS: Blood pressure (!) 143/95, pulse 72, temperature 98.2 F (36.8 C), temperature source Oral, resp. rate 18, height 5\' 10"  (1.778 m), weight 101.2 kg (223 lb), SpO2 100 %.  GENERAL:  75 y.o.-year-old patient lying in the bed In mild to moderate distress due to perineal discomfort after 40 catheter placement.  EYES: Pupils equal, round, reactive to light and accommodation. No scleral icterus. Extraocular muscles intact.  HEENT: Head atraumatic, normocephalic. Oropharynx and nasopharynx clear.  NECK:  Supple, no jugular venous distention. No thyroid enlargement, no tenderness.  LUNGS: Normal breath sounds bilaterally, no wheezing, rales,rhonchi or crepitation. No use of accessory muscles of respiration.  CARDIOVASCULAR: S1, S2 normal. No murmurs, rubs, or gallops.  ABDOMEN: Soft,  tender in the right flank, right lower quadrant, suprapubicly was some voluntary guarding, no rebound, nondistended. Foley catheter is place,  draining light yellow urine. Bowel sounds present. No organomegaly or mass.  EXTREMITIES: 2-3+ lower extremity and pedal edema bilaterally, most pronounced in the right lower extremity, no calf tenderness on palpation, no cyanosis, or clubbing.  NEUROLOGIC: Cranial nerves II through XII are intact. Muscle strength 5/5 in all extremities. Sensation intact. Gait not checked.  PSYCHIATRIC: The patient is alert and oriented x 3.  SKIN: No obvious rash, lesion, or ulcer.   LABORATORY PANEL:   CBC  Recent Labs Lab 02/12/16 1036  WBC 7.1  HGB 14.4  HCT 41.5  PLT 115*  MCV 88.4  MCH 30.7  MCHC 34.8  RDW 13.1  LYMPHSABS 1.0  MONOABS 0.8  EOSABS 0.1  BASOSABS 0.0   ------------------------------------------------------------------------------------------------------------------  Chemistries   Recent Labs Lab 02/12/16 1036  NA 135  K 3.2*  CL 100*  CO2 27  GLUCOSE 82  BUN 31*  CREATININE 2.87*  CALCIUM 9.0  AST 32  ALT 22  ALKPHOS 60  BILITOT 1.1   ------------------------------------------------------------------------------------------------------------------  Cardiac Enzymes  Recent Labs Lab 02/12/16 1036  TROPONINI <0.03   ------------------------------------------------------------------------------------------------------------------  RADIOLOGY: Ct Abdomen Pelvis Wo Contrast  Result Date: 02/12/2016 CLINICAL DATA:  75 year old male with bilateral flank and abdominal pain for 1 week. EXAM: CT ABDOMEN AND PELVIS WITHOUT CONTRAST TECHNIQUE: Multidetector CT imaging of the abdomen and pelvis was performed following the standard protocol without IV contrast. COMPARISON:  03/04/2013 CT FINDINGS: Please note that parenchymal abnormalities may be missed without intravenous contrast. Lower chest: No acute abnormality. Hepatobiliary: The liver and gallbladder are unremarkable. There is no evidence of biliary dilatation. Pancreas: Unremarkable Spleen: Unremarkable  Adrenals/Urinary Tract: Marked bladder distention identified with mild to moderate bilateral hydronephrosis. Mild circumferential bladder wall thickening with trabeculation noted. No urinary calculi are identified. The adrenal glands are unremarkable. Stomach/Bowel: There is no evidence of bowel obstruction or definite bowel wall thickening. The appendix is normal. Vascular/Lymphatic: No significant vascular findings are present. No enlarged abdominal or pelvic lymph nodes. Reproductive: Marked prostate enlargement noted. Other: No free fluid, focal collection or pneumoperitoneum. Musculoskeletal: No acute or significant osseous findings. IMPRESSION: Marked bladder distention with mild to moderate bilateral hydronephrosis. This is compatible with bladder outlet obstruction of which may be caused by marked prostate enlargement. No other acute or significant abnormality. Electronically Signed   By: Harmon Pier M.D.   On: 02/12/2016 13:31   Dg Chest 2 View  Result Date: 02/12/2016 CLINICAL DATA:  Abdominal pain. EXAM: CHEST  2 VIEW COMPARISON:  None. FINDINGS: The heart, hila, and mediastinum are normal. Mild atelectasis in the left lung base. Mild opacity in the medial right lung base is likely vascular crowding, more prominent due to patient rotation. No convincing focal infiltrate. IMPRESSION: Mild opacity in the medial right lung base is favored to represent confluence of vascular shadows, more prominent due to patient rotation. No other abnormalities. Electronically Signed   By: Gerome Sam III M.D   On: 02/12/2016 11:20   US Venous Img Lower Bilateral  Result Date: 02/12/2016 CLINICAL DATA:  75 year old male with new onset of leg swelling EXAM: BILATERAL LOWER EXTREMITY VENOUS DOPPLER ULTRASOUND TECHNIQUE: Gray-scale sonography with graded compression, as well as color Doppler and duplex ultrasound were performed to evaluate the lower extremity deep venous systems from the level of the common  femoral vein and including the common femoral, femoral, profunda femoral, popliteal and  calf veins including the posterior tibial, peroneal and gastrocnemius veins when visible. The superficial great saphenous vein was also interrogated. Spectral Doppler was utilized to evaluate flow at rest and with distal augmentation maneuvers in the common femoral, femoral and popliteal veins. COMPARISON:  None. FINDINGS: RIGHT LOWER EXTREMITY Common Femoral Vein: No evidence of thrombus. Normal compressibility, respiratory phasicity and response to augmentation. Saphenofemoral Junction: No evidence of thrombus. Normal compressibility and flow on color Doppler imaging. Profunda Femoral Vein: No evidence of thrombus. Normal compressibility and flow on color Doppler imaging. Femoral Vein: No evidence of thrombus. Normal compressibility, respiratory phasicity and response to augmentation. Popliteal Vein: No evidence of thrombus. Normal compressibility, respiratory phasicity and response to augmentation. Calf Veins: No evidence of thrombus. Normal compressibility and flow on color Doppler imaging. Superficial Great Saphenous Vein: No evidence of thrombus. Normal compressibility and flow on color Doppler imaging. Other Findings:  None. LEFT LOWER EXTREMITY Common Femoral Vein: No evidence of thrombus. Normal compressibility, respiratory phasicity and response to augmentation. Saphenofemoral Junction: No evidence of thrombus. Normal compressibility and flow on color Doppler imaging. Profunda Femoral Vein: No evidence of thrombus. Normal compressibility and flow on color Doppler imaging. Femoral Vein: No evidence of thrombus. Normal compressibility, respiratory phasicity and response to augmentation. Popliteal Vein: No evidence of thrombus. Normal compressibility, respiratory phasicity and response to augmentation. Calf Veins: No evidence of thrombus. Normal compressibility and flow on color Doppler imaging. Superficial Great Saphenous  Vein: No evidence of thrombus. Normal compressibility and flow on color Doppler imaging. Other Findings:  None. IMPRESSION: Sonographic survey of the bilateral lower extremities negative for DVT. Signed, Yvone Neu. Loreta Ave, DO [facility] Electronically Signed   By: Gilmer Mor D.O.   On: 02/12/2016 12:51    EKG: Orders placed or performed during the hospital encounter of 02/12/16  . ED EKG  . ED EKG  . EKG 12-Lead  . EKG 12-Lead    IMPRESSION AND PLAN:  Principal Problem:   Acute renal failure (ARF) (HCC) Active Problems:   Urinary retention   Malignant essential hypertension   Hypokalemia   Thrombocytopenia (HCC)   Flank pain #1. Acute renal failure due to bladder outlet obstruction, suspected BPH related, discontinue ARB, status post Foley catheter placement, continue patient on IV fluids, follow creatinine in the morning, get nephrologist involved, patient would benefit from urologist evaluation, likely as outpatient #2, flank pain, due to hydronephrosis, hydroureter, follow clinically. Should improve with Foley catheter placement. Urinalysis was unremarkable, urinary cultures are taken, antibiotics to be initiated if cultures are positive #3. Bladder outlet obstruction due to to BPH, get PSA, initiate patient on Flomax and finasteride, Foley catheter is going to be left for week until withdrawn in the urologist or primary care physician's office #4. Malignant essential hypertension, suspend ARB, initiate Norvasc #5. Lower extremity swelling, no DVT, likely due to distended bladder, obstructing venous return #6. Thrombocytopenia, etiology is unclear, follow platelet count tomorrow morning #7. Constipation, initiate patient on stool softeners and stimulants, follow output   All the records are reviewed and case discussed with ED provider. Management plans discussed with the patient, family and they are in agreement.  CODE  STATUS: Code Status History    This patient does not have a recorded code status. Please follow your organizational policy for patients in this situation.       TOTAL TIME TAKING CARE OF THIS PATIENT: 60 minutes.   Discussed with patient's wife Kern Gingras M.D on 02/12/2016 at 2:08  PM  Between 7am to 6pm - Pager - [phone] After 6pm go to [email or web address] - password [email or web address] [facility]  [facility]  Office  [phone]  CC: Primary care physician; [name], MD

## 2016-02-12 NOTE — ED Triage Notes (Signed)
Pt c/o BL Flank pain, worse on the right side with difficulty urinating to when he is sleeping incontinence.. States he saw his PCP last week and given cipro and flomax . States the sx progressed to the flank and abd pain over the past couple of days..[name] Kitchen

## 2016-02-12 NOTE — ED Provider Notes (Signed)
[facility] Emergency Department Provider Note   ____________________________________________   First MD Initiated Contact with Patient 02/12/16 1015     (approximate)  I have reviewed the triage vital signs and the nursing notes.   HISTORY  Chief Complaint Flank Pain   HPI Glen GriffithsRaymond E Spencer is a 75 y.o. male who reports she was doing well up until this summer when he had a colonoscopy. After that he began having trouble with some dysuria. Urine at the doctor's office was clear. He also had a paronychia and put trouble which he got antibiotics for his been on antibiotics 2 or 3 times in the last few months. Now he's been having some right-sided flank pain and still some dysuria for the last week or 2. He is also having what he feels is constipation is not stooling regularly. He is used prune juice and Dulcolax a lot of fluids still not having normal stools. His doctor thought he had possibly prostatitis so he is now on Cipro. He reports his legs and swelling some his left leg swelled up more than the right leg and now switched to the right leg swelling more than left leg. His not having any shortness of breath. Does have an occasional cough.   Past Medical History:  Diagnosis Date  . Anemia   . Hypertension     Patient Active Problem List   Diagnosis Date Noted  . Acute renal failure (ARF) (HCC) 02/12/2016  . Urinary retention 02/12/2016  . Malignant essential hypertension 02/12/2016  . Hypokalemia 02/12/2016  . Thrombocytopenia (HCC) 02/12/2016  . Flank pain 02/12/2016    Past Surgical History:  Procedure Laterality Date  . COLONOSCOPY WITH PROPOFOL N/A 08/08/2015   Procedure: COLONOSCOPY WITH PROPOFOL;  Surgeon: Wallace CullensPaul Y Oh, MD;  [facility];  Service: Gastroenterology;  Laterality: N/A;  . ESOPHAGOGASTRODUODENOSCOPY (EGD) WITH PROPOFOL N/A 08/08/2015   Procedure: ESOPHAGOGASTRODUODENOSCOPY (EGD) WITH PROPOFOL;  Surgeon: Wallace CullensPaul Y Oh, MD;   [facility];  Service: Gastroenterology;  Laterality: N/A;    Prior to Admission medications   Medication Sig Start Date End Date Taking? Authorizing Provider  valsartan (DIOVAN) 160 MG tablet Take 160 mg by mouth daily.    Historical Provider, MD    Allergies Review of patient's allergies indicates no known allergies.  No family history on file.  Social History Social History  Substance Use Topics  . Smoking status: Never Smoker  . Smokeless tobacco: Never Used  . Alcohol use No    Review of Systems Constitutional: No fever/chills Eyes: No visual changes. ENT: No sore throat. Cardiovascular: Denies chest pain. Respiratory: Denies shortness of breath. Gastrointestinal: See history of present illness patient complains of abdominal fullness and trouble stooling Genitourinary: Negative for dysuria. Musculoskeletal: See history of present illness patient's right flank pain. Skin: Negative for rash. Neurological: Negative for headaches, focal weakness or numbness.  10-point ROS otherwise negative.  ____________________________________________   PHYSICAL EXAM:  VITAL SIGNS: ED Triage Vitals  Enc Vitals Group     BP 02/12/16 0934 (!) 157/92     Pulse Rate 02/12/16 0934 88     Resp 02/12/16 0934 18     Temp 02/12/16 0934 98.2 F (36.8 C)     Temp Source 02/12/16 0934 Oral     SpO2 02/12/16 0934 100 %     Weight 02/12/16 0934 223 lb (101.2 kg)     Height 02/12/16 0934 5\' 10"  (1.778 m)     Head Circumference --  Peak Flow --      Pain Score 02/12/16 1021 4     Pain Loc --      Pain Edu? --      Excl. in GC? --     Constitutional: Alert and oriented. Well appearing and in no acute distress. Eyes: Conjunctivae are normal. PERRL. EOMI. Head: Atraumatic. Nose: No congestion/rhinnorhea. Mouth/Throat: Mucous membranes are moist.  Oropharynx non-erythematous. Neck: No stridor.  Cardiovascular: Normal rate, regular rhythm. Grossly normal heart sounds.   Good peripheral circulation. Respiratory: Normal respiratory effort.  No retractions. Lungs CTAB. Gastrointestinal: Soft Mild diffuse tenderness. Slight distention. No abdominal bruits. No CVA tenderness. Musculoskeletal: No lower extremity tenderness bilateral edema right worse than left.  No joint effusions. Neurologic:  Normal speech and language. No gross focal neurologic deficits are appreciated.  Skin:  Skin is warm, dry and intact. No rash noted. Psychiatric: Mood and affect are normal. Speech and behavior are normal.  ____________________________________________   LABS (all labs ordered are listed, but only abnormal results are displayed)  Labs Reviewed  URINALYSIS COMPLETEWITH MICROSCOPIC ([facility] ONLY) - Abnormal; Notable for the following:       Result Value   Color, Urine STRAW (*)    APPearance CLEAR (*)    Specific Gravity, Urine 1.004 (*)    Squamous Epithelial / LPF 0-5 (*)    All other components within normal limits  COMPREHENSIVE METABOLIC PANEL - Abnormal; Notable for the following:    Potassium 3.2 (*)    Chloride 100 (*)    BUN 31 (*)    Creatinine, Ser 2.87 (*)    GFR calc non Af Amer 20 (*)    GFR calc Af Amer 24 (*)    All other components within normal limits  CBC WITH DIFFERENTIAL/PLATELET - Abnormal; Notable for the following:    Platelets 115 (*)    All other components within normal limits  FIBRIN DERIVATIVES D-DIMER ([facility] ONLY) - Abnormal; Notable for the following:    Fibrin derivatives D-dimer ([facility]) 627 (*)    All other components within normal limits  TROPONIN I  BRAIN NATRIURETIC PEPTIDE  MAGNESIUM   ____________________________________________  EKG   ____________________________________________  RADIOLOGY  Study Result   CLINICAL DATA:  Abdominal pain.  EXAM: CHEST  2 VIEW  COMPARISON:  None.  FINDINGS: The heart, hila, and mediastinum are normal. Mild atelectasis in the left lung base. Mild opacity in the medial right  lung base is likely vascular crowding, more prominent due to patient rotation. No convincing focal infiltrate.  IMPRESSION: Mild opacity in the medial right lung base is favored to represent confluence of vascular shadows, more prominent due to patient rotation. No other abnormalities.   Electronically Signed   By: Gerome Sam III M.D   On: 02/12/2016 11:20   Study Result   CLINICAL DATA:  75 year old male with new onset of leg swelling  EXAM: BILATERAL LOWER EXTREMITY VENOUS DOPPLER ULTRASOUND  TECHNIQUE: Gray-scale sonography with graded compression, as well as color Doppler and duplex ultrasound were performed to evaluate the lower extremity deep venous systems from the level of the common femoral vein and including the common femoral, femoral, profunda femoral, popliteal and calf veins including the posterior tibial, peroneal and gastrocnemius veins when visible. The superficial great saphenous vein was also interrogated. Spectral Doppler was utilized to evaluate flow at rest and with distal augmentation maneuvers in the common femoral, femoral and popliteal veins.  COMPARISON:  None.  FINDINGS: RIGHT LOWER EXTREMITY  Common  Femoral Vein: No evidence of thrombus. Normal compressibility, respiratory phasicity and response to augmentation.  Saphenofemoral Junction: No evidence of thrombus. Normal compressibility and flow on color Doppler imaging.  Profunda Femoral Vein: No evidence of thrombus. Normal compressibility and flow on color Doppler imaging.  Femoral Vein: No evidence of thrombus. Normal compressibility, respiratory phasicity and response to augmentation.  Popliteal Vein: No evidence of thrombus. Normal compressibility, respiratory phasicity and response to augmentation.  Calf Veins: No evidence of thrombus. Normal compressibility and flow on color Doppler imaging.  Superficial Great Saphenous Vein: No evidence of thrombus.  Normal compressibility and flow on color Doppler imaging.  Other Findings:  None.  LEFT LOWER EXTREMITY  Common Femoral Vein: No evidence of thrombus. Normal compressibility, respiratory phasicity and response to augmentation.  Saphenofemoral Junction: No evidence of thrombus. Normal compressibility and flow on color Doppler imaging.  Profunda Femoral Vein: No evidence of thrombus. Normal compressibility and flow on color Doppler imaging.  Femoral Vein: No evidence of thrombus. Normal compressibility, respiratory phasicity and response to augmentation.  Popliteal Vein: No evidence of thrombus. Normal compressibility, respiratory phasicity and response to augmentation.  Calf Veins: No evidence of thrombus. Normal compressibility and flow on color Doppler imaging.  Superficial Great Saphenous Vein: No evidence of thrombus. Normal compressibility and flow on color Doppler imaging.  Other Findings:  None.  IMPRESSION: Sonographic survey of the bilateral lower extremities negative for DVT.  Signed,  Yvone Neu. Loreta Ave, DO  [facility]  [facility]   Electronically Signed   By: Gilmer Mor D.O.   On: 02/12/2016 12:51    Awaiting CT report however the CT shows massive distention of the bladder with enlarged prostate and bilateral hydronephrosis. ____________________________________________   PROCEDURES  Procedure(s) performed:   Procedures  Critical Care performed:  ____________________________________________   INITIAL IMPRESSION / ASSESSMENT AND PLAN / ED COURSE  Pertinent labs & imaging results that were available during my care of the patient were reviewed by me and considered in my medical decision making (see chart for details).   Clinical Course     ____________________________________________   FINAL CLINICAL IMPRESSION(S) / ED DIAGNOSES  Final diagnoses:  Renal failure,  unspecified chronicity  Urinary retention with incomplete bladder emptying      NEW MEDICATIONS STARTED DURING THIS VISIT:  New Prescriptions   No medications on file     Note:  This document was prepared using Dragon voice recognition software and may include unintentional dictation errors.    Arnaldo Natal, MD 02/12/16 1346

## 2016-02-12 NOTE — ED Notes (Signed)
Report called to Emily, RN

## 2016-02-12 NOTE — ED Notes (Signed)
CT notified that patient is done with 2nd bottle of contrast at this time.

## 2016-02-12 NOTE — ED Notes (Signed)
This RN to bedside at this time. Pt is noted to be in NAD. Pt is alert and oriented, his wife remains at bedside. Pt informed this RN he was instructed by CT to wait until 1245 to drink contrast due to his increase in creatinine, pt states will start drinking contrast at this time. Pt and his wife deny any needs at this time. Will continue to monitor for further patient needs.

## 2016-02-12 NOTE — ED Notes (Addendum)
Foley unclamped per MD order.

## 2016-02-13 ENCOUNTER — Telehealth: Payer: Self-pay

## 2016-02-13 LAB — BASIC METABOLIC PANEL
Anion gap: 6 (ref 5–15)
BUN: 24 mg/dL — ABNORMAL HIGH (ref 6–20)
CHLORIDE: 105 mmol/L (ref 101–111)
CO2: 27 mmol/L (ref 22–32)
Calcium: 8.5 mg/dL — ABNORMAL LOW (ref 8.9–10.3)
Creatinine, Ser: 1.7 mg/dL — ABNORMAL HIGH (ref 0.61–1.24)
GFR, EST AFRICAN AMERICAN: 44 mL/min — AB (ref >60)
GFR, EST NON AFRICAN AMERICAN: 38 mL/min — AB (ref >60)
Glucose, Bld: 96 mg/dL (ref 65–99)
POTASSIUM: 3.1 mmol/L — AB (ref 3.5–5.1)
SODIUM: 138 mmol/L (ref 135–145)

## 2016-02-13 LAB — CBC
HEMATOCRIT: 41.7 % (ref 40.0–52.0)
Hemoglobin: 14.3 g/dL (ref 13.0–18.0)
MCH: 30.5 pg (ref 26.0–34.0)
MCHC: 34.4 g/dL (ref 32.0–36.0)
MCV: 88.9 fL (ref 80.0–100.0)
PLATELETS: 116 10*3/uL — AB (ref 150–440)
RBC: 4.69 MIL/uL (ref 4.40–5.90)
RDW: 13.1 % (ref 11.5–14.5)
WBC: 6.9 10*3/uL (ref 3.8–10.6)

## 2016-02-13 LAB — GLUCOSE, CAPILLARY: Glucose-Capillary: 100 mg/dL — ABNORMAL HIGH (ref 65–99)

## 2016-02-13 MED ORDER — FINASTERIDE 5 MG PO TABS: 5.0000 mg | ORAL_TABLET | Freq: Every day | ORAL | 0 refills | Status: DC

## 2016-02-13 MED ORDER — POTASSIUM CHLORIDE CRYS ER 20 MEQ PO TBCR
40.0000 meq | EXTENDED_RELEASE_TABLET | Freq: Once | ORAL | Status: AC
  Administered 2016-02-13: 40 meq via ORAL
  Filled 2016-02-13: qty 2

## 2016-02-13 NOTE — Telephone Encounter (Signed)
-----   Message from Crist FatBenjamin W Herrick, MD sent at 02/13/2016 10:37 AM EDT ----- Regarding: f/u for voiding trial Patient discharged from hospital today and needs voiding trial and appointment to see urologist in one week. (any MD/PA)  Please call patient with appointment. Thanks, [employer]

## 2016-02-13 NOTE — Discharge Summary (Signed)
[facility] [place] [facility]   PATIENT NAME: Glen Spencer    MR#:  [id number]  DATE OF BIRTH:  1940-07-17  DATE OF ADMISSION:  02/12/2016 ADMITTING PHYSICIAN: Katharina Caper, MD  DATE OF DISCHARGE: 02/12/2016  PRIMARY CARE PHYSICIAN: [name], MD    ADMISSION DIAGNOSIS:  Urinary retention with incomplete bladder emptying [R33.9] Renal failure, unspecified chronicity [N19]  DISCHARGE DIAGNOSIS:  Principal Problem:   Acute renal failure (ARF) (HCC) Active Problems:   Urinary retention   Malignant essential hypertension   Hypokalemia   Thrombocytopenia (HCC)   Flank pain   SECONDARY DIAGNOSIS:   Past Medical History:  Diagnosis Date  . Anemia   . 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  1. Acute kidney injury in the setting of bladder outlet obstruction and enlarged prostate: Creatinine has improved with the insertion of Foley catheter.   2. 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 I spoke with urology who will see the patient in one week. Patient will need to have Foley in until urology follow-up.  3. Essential hypertension: Blood pressure has improved. He may continue his outpatient blood pressure medications. Acute kidney injury was due to bladder obstruction and therefore it would be safe for him to continue Diovan-HCTZ  4. GERD: Continue PPI  DISCHARGE CONDITIONS AND DIET:   Stable for discharge on heart healthy diet  CONSULTS OBTAINED:  Treatment Team:  Mosetta Pigeon, MD  DRUG ALLERGIES:  No Known Allergies  DISCHARGE MEDICATIONS:   Current Discharge Medication List    START taking these medications   Details  finasteride (PROSCAR) 5 MG tablet Take 1 tablet (5 mg total) by mouth daily. Qty: 30  tablet, Refills: 0      CONTINUE these medications which have NOT CHANGED   Details  pantoprazole (PROTONIX) 40 MG tablet Take 40 mg by mouth 2 (two) times daily. Take 30 to 45 minutes prior to first meal of the day    tamsulosin (FLOMAX) 0.4 MG CAPS capsule Take 0.4 mg by mouth daily. Take 30 minutes after same meal each day    valsartan-hydrochlorothiazide (DIOVAN-HCT) 160-25 MG tablet Take 1 tablet by mouth daily.              Today   CHIEF COMPLAINT:   Patient doing well this morning. Creatinine has improved. No abdominal pain. Small amount of blood-tinged urine  VITAL SIGNS:  Blood pressure 134/75, pulse 69, temperature 98.4 F (36.9 C), temperature source Oral, resp. rate 16, height 5\' 10"  (1.778 m), weight 99.3 kg (218 lb 14.4 oz), SpO2 97 %.   REVIEW OF SYSTEMS:  Review of Systems  Constitutional: Negative.  Negative for chills, fever and malaise/fatigue.  HENT: Negative.  Negative for ear discharge, ear pain, hearing loss, nosebleeds and sore throat.   Eyes: Negative.  Negative for blurred vision and pain.  Respiratory: Negative.  Negative for cough, hemoptysis, shortness of breath and wheezing.   Cardiovascular: Negative.  Negative for chest pain, palpitations and leg swelling.  Gastrointestinal: Negative.  Negative for abdominal pain, blood in stool, diarrhea, nausea and vomiting.  Genitourinary: Negative.  Negative for dysuria.  Musculoskeletal: Negative.  Negative for back pain.  Skin: Negative.   Neurological: Negative for dizziness, tremors, speech change, focal weakness, seizures and headaches.  Endo/Heme/Allergies: Negative.  Does  not bruise/bleed easily.  Psychiatric/Behavioral: Negative.  Negative for depression, hallucinations and suicidal ideas.     PHYSICAL EXAMINATION:  GENERAL:  75 y.o.-year-old patient lying in the bed with no acute distress.  NECK:  Supple, no jugular venous distention. No thyroid enlargement, no tenderness.  LUNGS:  Normal breath sounds bilaterally, no wheezing, rales,rhonchi  No use of accessory muscles of respiration.  CARDIOVASCULAR: S1, S2 normal. No murmurs, rubs, or gallops.  ABDOMEN: Soft, non-tender, non-distended. Bowel sounds present. No organomegaly or mass.  EXTREMITIES: No pedal edema, cyanosis, or clubbing.  PSYCHIATRIC: The patient is alert and oriented x 3.  SKIN: No obvious rash, lesion, or ulcer.   DATA REVIEW:   CBC  Recent Labs Lab 02/13/16 0418  WBC 6.9  HGB 14.3  HCT 41.7  PLT 116*    Chemistries   Recent Labs Lab 02/12/16 1036 02/13/16 0418  NA 135 138  K 3.2* 3.1*  CL 100* 105  CO2 27 27  GLUCOSE 82 96  BUN 31* 24*  CREATININE 2.87* 1.70*  CALCIUM 9.0 8.5*  MG 2.6*  --   AST 32  --   ALT 22  --   ALKPHOS 60  --   BILITOT 1.1  --     Cardiac Enzymes  Recent Labs Lab 02/12/16 1036  TROPONINI <0.03    Microbiology Results  @[email or web address] @  RADIOLOGY:  Ct Abdomen Pelvis Wo Contrast  Result Date: 02/12/2016 CLINICAL DATA:  75 year old male with bilateral flank and abdominal pain for 1 week. EXAM: CT ABDOMEN AND PELVIS WITHOUT CONTRAST TECHNIQUE: Multidetector CT imaging of the abdomen and pelvis was performed following the standard protocol without IV contrast. COMPARISON:  03/04/2013 CT FINDINGS: Please note that parenchymal abnormalities may be missed without intravenous contrast. Lower chest: No acute abnormality. Hepatobiliary: The liver and gallbladder are unremarkable. There is no evidence of biliary dilatation. Pancreas: Unremarkable Spleen: Unremarkable Adrenals/Urinary Tract: Marked bladder distention identified with mild to moderate bilateral hydronephrosis. Mild circumferential bladder wall thickening with trabeculation noted. No urinary calculi are identified. The adrenal glands are unremarkable. Stomach/Bowel: There is no evidence of bowel obstruction or definite bowel wall thickening. The appendix is normal. Vascular/Lymphatic: No significant  vascular findings are present. No enlarged abdominal or pelvic lymph nodes. Reproductive: Marked prostate enlargement noted. Other: No free fluid, focal collection or pneumoperitoneum. Musculoskeletal: No acute or significant osseous findings. IMPRESSION: Marked bladder distention with mild to moderate bilateral hydronephrosis. This is compatible with bladder outlet obstruction of which may be caused by marked prostate enlargement. No other acute or significant abnormality. Electronically Signed   By: Harmon PierJeffrey  Hu M.D.   On: 02/12/2016 13:31   Dg Chest 2 View  Result Date: 02/12/2016 CLINICAL DATA:  Abdominal pain. EXAM: CHEST  2 VIEW COMPARISON:  None. FINDINGS: The heart, hila, and mediastinum are normal. Mild atelectasis in the left lung base. Mild opacity in the medial right lung base is likely vascular crowding, more prominent due to patient rotation. No convincing focal infiltrate. IMPRESSION: Mild opacity in the medial right lung base is favored to represent confluence of vascular shadows, more prominent due to patient rotation. No other abnormalities. Electronically Signed   By: Gerome Samavid  Williams III M.D   On: 02/12/2016 11:20   [place] Venous Img Lower Bilateral  Result Date: 02/12/2016 CLINICAL DATA:  75 year old male with new onset of leg swelling EXAM: BILATERAL LOWER EXTREMITY VENOUS DOPPLER ULTRASOUND TECHNIQUE: Gray-scale sonography with graded compression, as well as color Doppler and duplex ultrasound were performed to evaluate  the lower extremity deep venous systems from the level of the common femoral vein and including the common femoral, femoral, profunda femoral, popliteal and calf veins including the posterior tibial, peroneal and gastrocnemius veins when visible. The superficial great saphenous vein was also interrogated. Spectral Doppler was utilized to evaluate flow at rest and with distal augmentation maneuvers in the common femoral, femoral and popliteal veins. COMPARISON:  None.  FINDINGS: RIGHT LOWER EXTREMITY Common Femoral Vein: No evidence of thrombus. Normal compressibility, respiratory phasicity and response to augmentation. Saphenofemoral Junction: No evidence of thrombus. Normal compressibility and flow on color Doppler imaging. Profunda Femoral Vein: No evidence of thrombus. Normal compressibility and flow on color Doppler imaging. Femoral Vein: No evidence of thrombus. Normal compressibility, respiratory phasicity and response to augmentation. Popliteal Vein: No evidence of thrombus. Normal compressibility, respiratory phasicity and response to augmentation. Calf Veins: No evidence of thrombus. Normal compressibility and flow on color Doppler imaging. Superficial Great Saphenous Vein: No evidence of thrombus. Normal compressibility and flow on color Doppler imaging. Other Findings:  None. LEFT LOWER EXTREMITY Common Femoral Vein: No evidence of thrombus. Normal compressibility, respiratory phasicity and response to augmentation. Saphenofemoral Junction: No evidence of thrombus. Normal compressibility and flow on color Doppler imaging. Profunda Femoral Vein: No evidence of thrombus. Normal compressibility and flow on color Doppler imaging. Femoral Vein: No evidence of thrombus. Normal compressibility, respiratory phasicity and response to augmentation. Popliteal Vein: No evidence of thrombus. Normal compressibility, respiratory phasicity and response to augmentation. Calf Veins: No evidence of thrombus. Normal compressibility and flow on color Doppler imaging. Superficial Great Saphenous Vein: No evidence of thrombus. Normal compressibility and flow on color Doppler imaging. Other Findings:  None. IMPRESSION: Sonographic survey of the bilateral lower extremities negative for DVT. Signed, Yvone Neu. Loreta Ave, DO [facility] Electronically Signed   By: Gilmer Mor D.O.   On: 02/12/2016 12:51      Management plans discussed  with the patient and he is in agreement. Stable for discharge home  Patient should follow up with urology 1 week D/w urology Rounded with nursing  CODE STATUS:     Code Status Orders        Start     Ordered   02/12/16 1515  Full code  Continuous     02/12/16 1514    Code Status History    Date Active Date Inactive Code Status Order ID Comments User Context   This patient has a current code status but no historical code status.      TOTAL TIME TAKING CARE OF THIS PATIENT: 36 minutes.    Note: This dictation was prepared with Dragon dictation along with smaller phrase technology. Any transcriptional errors that result from this process are unintentional.  Demri Poulton M.D on 02/13/2016 at 11:42 AM  Between 7am to 6pm - Pager - [phone] After 6pm go to [email or web address] - Social research officer, government  [facility]  Office  [phone]  CC: Primary care physician; [name], MD

## 2016-02-13 NOTE — Progress Notes (Signed)
Patient being discharged with leg bag foley. RN provided teaching on foley care and how to empty leg bag. Patient does have a follow up urology appointment.  Harvie HeckMelanie Jesyka Slaght, RN

## 2016-02-13 NOTE — Progress Notes (Signed)
Glen Spencer to be D/C'd Home per MD order.  Discussed with the patient and all questions fully answered.  VSS, Skin clean, dry and intact without evidence of skin break down, no evidence of skin tears noted. IV catheter discontinued intact. Site without signs and symptoms of complications. Dressing and pressure applied.  An After Visit Summary was printed and given to the patient. Patient received prescription.  D/c education completed with patient/family including follow up instructions, medication list, d/c activities limitations if indicated, with other d/c instructions as indicated by MD - patient able to verbalize understanding, all questions fully answered.   Patient instructed to return to ED, call 911, or call MD for any changes in condition.   Patient escorted via WC, and D/C home via private auto.  Shawna OrleansMelanie Dorinne Graeff 02/13/2016 12:12 PM

## 2016-02-13 NOTE — Consult Note (Signed)
[facility] CONSULT NOTE    Date: 02/13/2016                  Patient Name:  Glen Spencer  MRN: [id number]  DOB: 02-Jan-1941  Age / Sex: 75 y.o., male         PCP: [name], MD                 Service Requesting Consult: Hospitalist                 Reason for Consult: Acute renal failure, hydronephrosis            History of Present Illness: Patient is a 75 y.o. male with a PMHx of hypertension, GERD, BPH, who was admitted to [facility] on 02/12/2016 for evaluation of Dysuria and right flank pain. Patient reports that over the past several months he's had difficulties with urination. He's had poor urinary stream and also recently had frequency of urination. He saw a PA at his primary care physician's office and was prescribed ciprofloxacin.  CT scan of the abdomen and pelvis was performed which demonstrated marked bladder distention with mild to moderate bilateral hydronephrosis. There is bladder outlet obstruction. Foley catheter was placed and after Foley catheter was placed 9.1 L of urine output was noted. The patient's creatinine upon admission was 2.87 and is currently down to 1.7. He is in much better spirits today. It appears that he will have an outpatient urology evaluation. The plan is for the Foley catheter to be kept in place for now.   Medications: Outpatient medications: Prescriptions Prior to Admission  Medication Sig Dispense Refill Last Dose  . pantoprazole (PROTONIX) 40 MG tablet Take 40 mg by mouth 2 (two) times daily. Take 30 to 45 minutes prior to first meal of the day   02/11/2016 at 0830  . tamsulosin (FLOMAX) 0.4 MG CAPS capsule Take 0.4 mg by mouth daily. Take 30 minutes after same meal each day   Past Week at Unknown time  . valsartan-hydrochlorothiazide (DIOVAN-HCT) 160-25 MG tablet Take 1 tablet by mouth daily.   02/11/2016 at 0830    Current medications: Current Facility-Administered Medications  Medication Dose Route  Frequency Provider Last Rate Last Dose  . 0.9 %  sodium chloride infusion   Intravenous Continuous Katharina Caper, MD 75 mL/hr at 02/13/16 0515    . acetaminophen (TYLENOL) tablet 650 mg  650 mg Oral Q6H PRN Katharina Caper, MD       Or  . acetaminophen (TYLENOL) suppository 650 mg  650 mg Rectal Q6H PRN Katharina Caper, MD      . amLODipine (NORVASC) tablet 5 mg  5 mg Oral BID Katharina Caper, MD   5 mg at 02/13/16 1008  . docusate sodium (COLACE) capsule 100 mg  100 mg Oral BID Katharina Caper, MD   100 mg at 02/13/16 1008  . finasteride (PROSCAR) tablet 5 mg  5 mg Oral Daily Katharina Caper, MD   5 mg at 02/13/16 1008  . heparin injection 5,000 Units  5,000 Units Subcutaneous Q8H Katharina Caper, MD   5,000 Units at 02/13/16 0459  . ondansetron (ZOFRAN) tablet 4 mg  4 mg Oral Q6H PRN Katharina Caper, MD       Or  . ondansetron (ZOFRAN) injection 4 mg  4 mg Intravenous Q6H PRN Katharina Caper, MD      . pantoprazole (PROTONIX) EC tablet 40 mg  40 mg Oral BID Katharina Caper, MD  40 mg at 02/13/16 1008  . senna (SENOKOT) tablet 8.6 mg  1 tablet Oral BID Katharina Caperima Vaickute, MD   8.6 mg at 02/13/16 1008  . sodium chloride flush (NS) 0.9 % injection 3 mL  3 mL Intravenous Q12H Katharina Caperima Vaickute, MD   3 mL at 02/13/16 1011  . tamsulosin (FLOMAX) capsule 0.4 mg  0.4 mg Oral Daily Katharina Caperima Vaickute, MD   0.4 mg at 02/13/16 1008      Allergies: No Known Allergies    Past Medical History: Past Medical History:  Diagnosis Date  . Anemia   . Hypertension      Past Surgical History: Past Surgical History:  Procedure Laterality Date  . COLONOSCOPY WITH PROPOFOL N/A 08/08/2015   Procedure: COLONOSCOPY WITH PROPOFOL;  Surgeon: Wallace CullensPaul Y Oh, MD;  Location: [facility] ENDOSCOPY;  Service: Gastroenterology;  Laterality: N/A;  . ESOPHAGOGASTRODUODENOSCOPY (EGD) WITH PROPOFOL N/A 08/08/2015   Procedure: ESOPHAGOGASTRODUODENOSCOPY (EGD) WITH PROPOFOL;  Surgeon: Wallace CullensPaul Y Oh, MD;  Location[facility] ENDOSCOPY;  Service: Gastroenterology;   Laterality: N/A;     Family History: No family history on file.   Social History: Social History   Social History  . Marital status: Married    Spouse name: N/A  . Number of children: N/A  . Years of education: N/A   Occupational History  . Not on file.   Social History Main Topics  . Smoking status: Never Smoker  . Smokeless tobacco: Never Used  . Alcohol use No  . Drug use: No  . Sexual activity: Not on file   Other Topics Concern  . Not on file   Social History Narrative  . No narrative on file     Review of Systems: Review of Systems  Constitutional: Positive for malaise/fatigue. Negative for chills and fever.  HENT: Negative for hearing loss and tinnitus.   Eyes: Negative for blurred vision, double vision and photophobia.  Respiratory: Negative for cough, hemoptysis and sputum production.   Cardiovascular: Negative for chest pain, palpitations and orthopnea.  Gastrointestinal: Positive for abdominal pain. Negative for heartburn, nausea and vomiting.  Genitourinary: Positive for dysuria, frequency, hematuria and urgency.  Musculoskeletal: Negative for myalgias and neck pain.  Skin: Negative for itching and rash.  Neurological: Negative for dizziness, focal weakness and headaches.  Endo/Heme/Allergies: Bruises/bleeds easily.  Psychiatric/Behavioral: Negative for depression. The patient is nervous/anxious.      Vital Signs: Blood pressure 134/75, pulse 69, temperature 98.4 F (36.9 C), temperature source Oral, resp. rate 16, height 5\' 10"  (1.778 m), weight 99.3 kg (218 lb 14.4 oz), SpO2 97 %.  Weight trends: Filed Weights   02/12/16 0934 02/12/16 1500  Weight: 101.2 kg (223 lb) 99.3 kg (218 lb 14.4 oz)    Physical Exam: General: NAD,   Head: Normocephalic, atraumatic.  Eyes: Anicteric, EOMI  Nose: Mucous membranes moist, not inflammed, nonerythematous.  Throat: Oropharynx nonerythematous, no exudate appreciated.   Neck: Supple, trachea midline.   Lungs:  Normal respiratory effort. Clear to auscultation BL without crackles or wheezes.  Heart: RRR. S1 and S2 normal without gallop, murmur, or rubs.  Abdomen:  BS normoactive. Soft, Nondistended, non-tender.  No masses or organomegaly.  Extremities: No pretibial edema.  Neurologic: A&O X3, Motor strength is 5/5 in the all 4 extremities  Skin: No visible rashes, scars.    Lab results: Basic Metabolic Panel:  Recent Labs Lab 02/12/16 1036 02/13/16 0418  NA 135 138  K 3.2* 3.1*  CL 100* 105  CO2 27 27  GLUCOSE  82 96  BUN 31* 24*  CREATININE 2.87* 1.70*  CALCIUM 9.0 8.5*  MG 2.6*  --     Liver Function Tests:  Recent Labs Lab 02/12/16 1036  AST 32  ALT 22  ALKPHOS 60  BILITOT 1.1  PROT 7.0  ALBUMIN 3.9   No results for input(s): LIPASE, AMYLASE in the last 168 hours. No results for input(s): AMMONIA in the last 168 hours.  CBC:  Recent Labs Lab 02/12/16 1036 02/13/16 0418  WBC 7.1 6.9  NEUTROABS 5.2  --   HGB 14.4 14.3  HCT 41.5 41.7  MCV 88.4 88.9  PLT 115* 116*    Cardiac Enzymes:  Recent Labs Lab 02/12/16 1036  TROPONINI <0.03    BNP: Invalid input(s): POCBNP  CBG:  Recent Labs Lab 02/13/16 0739  GLUCAP 100*    Microbiology: No results found for this or any previous visit.  Coagulation Studies: No results for input(s): LABPROT, INR in the last 72 hours.  Urinalysis:  Recent Labs  02/12/16 0941  COLORURINE STRAW*  LABSPEC 1.004*  PHURINE 6.0  GLUCOSEU NEGATIVE  HGBUR NEGATIVE  BILIRUBINUR NEGATIVE  KETONESUR NEGATIVE  PROTEINUR NEGATIVE  NITRITE NEGATIVE  LEUKOCYTESUR NEGATIVE      Imaging: Ct Abdomen Pelvis Wo Contrast  Result Date: 02/12/2016 CLINICAL DATA:  75 year old male with bilateral flank and abdominal pain for 1 week. EXAM: CT ABDOMEN AND PELVIS WITHOUT CONTRAST TECHNIQUE: Multidetector CT imaging of the abdomen and pelvis was performed following the standard protocol without IV contrast. COMPARISON:   03/04/2013 CT FINDINGS: Please note that parenchymal abnormalities may be missed without intravenous contrast. Lower chest: No acute abnormality. Hepatobiliary: The liver and gallbladder are unremarkable. There is no evidence of biliary dilatation. Pancreas: Unremarkable Spleen: Unremarkable Adrenals/Urinary Tract: Marked bladder distention identified with mild to moderate bilateral hydronephrosis. Mild circumferential bladder wall thickening with trabeculation noted. No urinary calculi are identified. The adrenal glands are unremarkable. Stomach/Bowel: There is no evidence of bowel obstruction or definite bowel wall thickening. The appendix is normal. Vascular/Lymphatic: No significant vascular findings are present. No enlarged abdominal or pelvic lymph nodes. Reproductive: Marked prostate enlargement noted. Other: No free fluid, focal collection or pneumoperitoneum. Musculoskeletal: No acute or significant osseous findings. IMPRESSION: Marked bladder distention with mild to moderate bilateral hydronephrosis. This is compatible with bladder outlet obstruction of which may be caused by marked prostate enlargement. No other acute or significant abnormality. Electronically Signed   By: Harmon Pier M.D.   On: 02/12/2016 13:31   Dg Chest 2 View  Result Date: 02/12/2016 CLINICAL DATA:  Abdominal pain. EXAM: CHEST  2 VIEW COMPARISON:  None. FINDINGS: The heart, hila, and mediastinum are normal. Mild atelectasis in the left lung base. Mild opacity in the medial right lung base is likely vascular crowding, more prominent due to patient rotation. No convincing focal infiltrate. IMPRESSION: Mild opacity in the medial right lung base is favored to represent confluence of vascular shadows, more prominent due to patient rotation. No other abnormalities. Electronically Signed   By: Gerome Sam III M.D   On: 02/12/2016 11:20   US Venous Img Lower Bilateral  Result Date: 02/12/2016 CLINICAL DATA:  75 year old male  with new onset of leg swelling EXAM: BILATERAL LOWER EXTREMITY VENOUS DOPPLER ULTRASOUND TECHNIQUE: Gray-scale sonography with graded compression, as well as color Doppler and duplex ultrasound were performed to evaluate the lower extremity deep venous systems from the level of the common femoral vein and including the common femoral, femoral, profunda femoral, popliteal and  calf veins including the posterior tibial, peroneal and gastrocnemius veins when visible. The superficial great saphenous vein was also interrogated. Spectral Doppler was utilized to evaluate flow at rest and with distal augmentation maneuvers in the common femoral, femoral and popliteal veins. COMPARISON:  None. FINDINGS: RIGHT LOWER EXTREMITY Common Femoral Vein: No evidence of thrombus. Normal compressibility, respiratory phasicity and response to augmentation. Saphenofemoral Junction: No evidence of thrombus. Normal compressibility and flow on color Doppler imaging. Profunda Femoral Vein: No evidence of thrombus. Normal compressibility and flow on color Doppler imaging. Femoral Vein: No evidence of thrombus. Normal compressibility, respiratory phasicity and response to augmentation. Popliteal Vein: No evidence of thrombus. Normal compressibility, respiratory phasicity and response to augmentation. Calf Veins: No evidence of thrombus. Normal compressibility and flow on color Doppler imaging. Superficial Great Saphenous Vein: No evidence of thrombus. Normal compressibility and flow on color Doppler imaging. Other Findings:  None. LEFT LOWER EXTREMITY Common Femoral Vein: No evidence of thrombus. Normal compressibility, respiratory phasicity and response to augmentation. Saphenofemoral Junction: No evidence of thrombus. Normal compressibility and flow on color Doppler imaging. Profunda Femoral Vein: No evidence of thrombus. Normal compressibility and flow on color Doppler imaging. Femoral Vein: No evidence of thrombus. Normal compressibility,  respiratory phasicity and response to augmentation. Popliteal Vein: No evidence of thrombus. Normal compressibility, respiratory phasicity and response to augmentation. Calf Veins: No evidence of thrombus. Normal compressibility and flow on color Doppler imaging. Superficial Great Saphenous Vein: No evidence of thrombus. Normal compressibility and flow on color Doppler imaging. Other Findings:  None. IMPRESSION: Sonographic survey of the bilateral lower extremities negative for DVT. Signed, Yvone Neu. Loreta Ave, DO [facility] Electronically Signed   By: Gilmer Mor D.O.   On: 02/12/2016 12:51      Assessment & Plan: Pt is a 75 y.o. male with a PMHx of hypertension, GERD, BPH, who was admitted to [facility] on 02/12/2016 for evaluation of Dysuria and right flank pain.  1.  Acute renal failure due to bilateral hydronephrosis and bladder outlet obstruction. 2.  Bilateral hydronephrosis. 3.  Hypokalemia. 4.  Hypertension.  Plan:  The patient originally presented with dysuria, frequency, and poor urinary stream. It appears he has bladder outlet obstruction with resulting hydronephrosis and acute renal failure. The patient at 9.1 L of urine output after Foley catheter placement. He will need a Foley catheter to remain in place for at least 1-2 weeks prior to urodynamic studies. Agree with urology consultation as an outpatient. I advised the patient that his hematuria should clear over the next several days. I also advised the patient to maintain adequate by mouth fluid hydration status. We would like to follow-up his renal function as an outpatient in the next week or 2. Hopefully this will return to his prior baseline.  Thanks for consultation.

## 2016-02-14 NOTE — Telephone Encounter (Signed)
appt already made.

## 2016-02-16 LAB — URINE CULTURE

## 2016-02-22 ENCOUNTER — Encounter: Payer: Self-pay | Admitting: Urology

## 2016-02-22 ENCOUNTER — Ambulatory Visit: Payer: Medicare Other | Admitting: Urology

## 2016-02-22 VITALS — BP 126/80 | HR 71 | Ht 70.0 in | Wt 212.6 lb

## 2016-02-22 DIAGNOSIS — R339 Retention of urine, unspecified: Secondary | ICD-10-CM | POA: Diagnosis not present

## 2016-02-22 DIAGNOSIS — N401 Enlarged prostate with lower urinary tract symptoms: Secondary | ICD-10-CM

## 2016-02-22 DIAGNOSIS — N472 Paraphimosis: Secondary | ICD-10-CM

## 2016-02-22 DIAGNOSIS — N138 Other obstructive and reflux uropathy: Secondary | ICD-10-CM

## 2016-02-22 DIAGNOSIS — N179 Acute kidney failure, unspecified: Secondary | ICD-10-CM

## 2016-02-22 DIAGNOSIS — N189 Chronic kidney disease, unspecified: Secondary | ICD-10-CM

## 2016-02-22 NOTE — Progress Notes (Signed)
Fill and Pull Catheter Removal  Patient is present today for a catheter removal.  Patient was cleaned and prepped in a sterile fashion 350ml of sterile water/ saline was instilled into the bladder when the patient felt the urge to urinate. 10ml of water was then drained from the balloon.  A 16FR foley cath was removed from the bladder no complications were noted .  Patient as then given some time to void on their own.  Patient cannot void on their own after some time.  Patient tolerated well.  Preformed by: Teressa Lowerarrie Terran Hollenkamp, CMA  Follow up/ Additional notes: He is to come back this afternoon if he is unable to void this morning.  Simple Catheter Placement  Due to urinary retention patient is present today for a foley cath placement.  Patient was cleaned and prepped in a sterile fashion with betadine and lidocaine jelly 2% was instilled into the urethra.  A 16 FR foley catheter was inserted, urine return was noted  725ml, urine was yellow in color.  The balloon was filled with 10cc of sterile water.  A leg bag was attached for drainage. Patient was also given a night bag to take home and was given instruction on how to change from one bag to another.  Patient was given instruction on proper catheter care.  Patient tolerated well, no complications were noted   Preformed by: Teressa Lowerarrie Llewellyn Schoenberger, CMA  Additional notes/ Follow up: 1 week with Dr. Apolinar JunesBrandon to discuss possible surgery.

## 2016-02-22 NOTE — Progress Notes (Signed)
02/22/2016 3:55 PM   Glen Spencer 09-09-1940 [id number]  Referring provider: Dorothey Basemanavid Bronstein, MD [phone] [place], [place] [id number]  Chief Complaint  Patient presents with  . Routine Post Op    voiding trial    HPI: 75 year old male who presents to the office for further evaluation of severe urinary retention with bilateral hydroureteronephrosis, acute renal failure requiring hospitalization. He was discharged on 02/13/2016.     He has a known history of urinary issues was treated for urinary tract infection in 09/2015.  He was started on Flomax his PCP.  Since then, he has had difficulty urinating and emptying his bladder. Shortly prior to his admission, he was having urinary leakage and incontinence.  Upon presenting to the emergency room, a Foley catheter was placed in greater than 2.5 L was drained.  Creatinine on admission was 2.87 and improved to 1.7 by the time of discharge. It appears that his baseline is around 1.1.  His Foley catheter has remained in place since discharge.  In addition to Flomax, he was started on finasteride during the admission and continues this today.  He also notes today that he's had significant penile swelling for the past 3 days which is gone worse. He is uncircumcised.  Most recent PSA 2.23 on 07/07/2015.  He denies a personal history of urinary retention. He is somewhat of a difficult historian turned to recall his baseline urinary symptoms prior to June. He thinks that these issues have been slowly worsening over time.  PMH: Past Medical History:  Diagnosis Date  . Anemia   . Hypertension     Surgical History: Past Surgical History:  Procedure Laterality Date  . COLONOSCOPY WITH PROPOFOL N/A 08/08/2015   Procedure: COLONOSCOPY WITH PROPOFOL;  Surgeon: Wallace CullensPaul Y Oh, MD;  [facility]COPY;  Service: Gastroenterology;  Laterality: N/A;  . ESOPHAGOGASTRODUODENOSCOPY (EGD) WITH PROPOFOL N/A 08/08/2015   Procedure:  ESOPHAGOGASTRODUODENOSCOPY (EGD) WITH PROPOFOL;  Surgeon: Wallace CullensPaul Y Oh, MD;  [facility] ENDOSCOPY;  Service: Gastroenterology;  Laterality: N/A;    Home Medications:    Medication List       Accurate as of 02/22/16  3:55 PM. Always use your most recent med list.          finasteride 5 MG tablet Commonly known as:  PROSCAR Take 1 tablet (5 mg total) by mouth daily.   pantoprazole 40 MG tablet Commonly known as:  PROTONIX Take 40 mg by mouth 2 (two) times daily. Take 30 to 45 minutes prior to first meal of the day   tamsulosin 0.4 MG Caps capsule Commonly known as:  FLOMAX Take 0.4 mg by mouth daily. Take 30 minutes after same meal each day   valsartan-hydrochlorothiazide 160-25 MG tablet Commonly known as:  DIOVAN-HCT Take 1 tablet by mouth daily.       Allergies: No Known Allergies  Family History: Family History  Problem Relation Age of Onset  . Prostate cancer Neg Hx   . Bladder Cancer Neg Hx   . Kidney cancer Neg Hx     Social History:  reports that he has never smoked. He has never used smokeless tobacco. He reports that he does not drink alcohol or use drugs.  ROS: UROLOGY Frequent Urination?: Yes Hard to postpone urination?: Yes Burning/pain with urination?: Yes Get up at night to urinate?: Yes Leakage of urine?: Yes Urine stream starts and stops?: No Trouble starting stream?: No Do you have to strain to urinate?: No Blood in urine?: Yes  Urinary tract infection?: No Sexually transmitted disease?: No Injury to kidneys or bladder?: No Painful intercourse?: No Weak stream?: No Erection problems?: No Penile pain?: Yes  Gastrointestinal Nausea?: No Vomiting?: No Indigestion/heartburn?: No Diarrhea?: No Constipation?: Yes  Constitutional Fever: No Night sweats?: No Weight loss?: No Fatigue?: No  Skin Skin rash/lesions?: No Itching?: No  Eyes Blurred vision?: No Double vision?: No  Ears/Nose/Throat Sore throat?: No Sinus problems?:  No  Hematologic/Lymphatic Swollen glands?: No Easy bruising?: No  Cardiovascular Leg swelling?: No Chest pain?: No  Respiratory Cough?: No Shortness of breath?: No  Endocrine Excessive thirst?: No  Musculoskeletal Back pain?: No Joint pain?: No  Neurological Headaches?: No Dizziness?: No  Psychologic Depression?: No  Physical Exam: BP 126/80 (BP Location: Left Arm, Patient Position: Sitting, Cuff Size: Large)   Pulse 71   Ht 5\' 10"  (1.778 m)   Wt 212 lb 9.6 oz (96.4 kg)   BMI 30.50 kg/m   Constitutional:  Alert and oriented, No acute distress.  Accompanied by wife today. HEENT: [place] AT, moist mucus membranes.  Trachea midline, no masses. Cardiovascular: No clubbing, cyanosis, or edema. Respiratory: Normal respiratory effort, no increased work of breathing. GI: Abdomen is soft, nontender, nondistended, no abdominal masses GU: Uncircumcised phallus with significant paraphimosis, glandular edema. This was reduced today.  There is some mild irritation/erosion just at the urethral meatus. Foley catheter is in place draining clear yellow urine. Rectal exam: External hemorrhoids noted. Normal sphincter tone. Massively enlarged prostate, rubbery, only able to palpate apex due to size of the gland. Skin: No rashes, bruises or suspicious lesions. Lymph: No cervical or inguinal adenopathy. Neurologic: Grossly intact, no focal deficits, moving all 4 extremities. Psychiatric: Normal mood and affect.  Laboratory Data: Lab Results  Component Value Date   WBC 6.9 02/13/2016   HGB 14.3 02/13/2016   HCT 41.7 02/13/2016   MCV 88.9 02/13/2016   PLT 116 (L) 02/13/2016    Lab Results  Component Value Date   CREATININE 1.70 (H) 02/13/2016    Lab Results  Component Value Date   PSA 3.52 02/12/2016    Urinalysis    Component Value Date/Time   COLORURINE STRAW (A) 02/12/2016 0941   APPEARANCEUR CLEAR (A) 02/12/2016 0941   APPEARANCEUR Clear 02/09/2013 1142   LABSPEC 1.004  (L) 02/12/2016 0941   LABSPEC 1.008 02/09/2013 1142   PHURINE 6.0 02/12/2016 0941   GLUCOSEU NEGATIVE 02/12/2016 0941   GLUCOSEU Negative 02/09/2013 1142   HGBUR NEGATIVE 02/12/2016 0941   BILIRUBINUR NEGATIVE 02/12/2016 0941   BILIRUBINUR Negative 02/09/2013 1142   KETONESUR NEGATIVE 02/12/2016 0941   PROTEINUR NEGATIVE 02/12/2016 0941   NITRITE NEGATIVE 02/12/2016 0941   LEUKOCYTESUR NEGATIVE 02/12/2016 0941   LEUKOCYTESUR Trace 02/09/2013 1142    Pertinent Imaging: CT scan from 02/12/2016 reviews personally today. He does have a massively distended bladder with bilateral hydronephrosis. His prostate is significantly enlarged, calculated volume 197 cc.  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  Assessment & Plan:    1. Urinary retention Continue Flomax and finasteride Voiding trial performed today although based on the degree of bladder distention, greater than 2.5 L, I suspect that he will not be able to void spontaneously He will return later this afternoon for bladder scan and rechecked, if unable to void,   2. 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 Acute renal failure superimposed on chronic kidney disease, unspecified CKD stage, unspecified acute renal failure type (HCC) Secondary to outlet obstruction Plan to recheck labs later date  4. Paraphimosis Paraphimosis reduced today, teaching about keeping foreskin forward was reviewed in detail   VT  recheck this afternoon and f/u in 1 week of fails VT for cysto/ discuss surgery  [name], MD  [facility] [address], [address] [place], [place] [address] [phone]

## 2016-02-23 ENCOUNTER — Encounter: Payer: Self-pay | Admitting: Emergency Medicine

## 2016-02-23 ENCOUNTER — Telehealth: Payer: Self-pay | Admitting: Urology

## 2016-02-23 ENCOUNTER — Telehealth: Payer: Self-pay

## 2016-02-23 ENCOUNTER — Emergency Department
Admission: EM | Admit: 2016-02-23 | Discharge: 2016-02-23 | Disposition: A | Discharge status: Home / Self Care | Payer: Medicare Other | Attending: Emergency Medicine | Admitting: Emergency Medicine

## 2016-02-23 DIAGNOSIS — I10 Essential (primary) hypertension: Secondary | ICD-10-CM | POA: Diagnosis not present

## 2016-02-23 DIAGNOSIS — Z79899 Other long term (current) drug therapy: Secondary | ICD-10-CM | POA: Diagnosis not present

## 2016-02-23 DIAGNOSIS — R310 Gross hematuria: Secondary | ICD-10-CM | POA: Insufficient documentation

## 2016-02-23 HISTORY — DX: Benign prostatic hyperplasia with lower urinary tract symptoms: N40.1

## 2016-02-23 HISTORY — DX: Other retention of urine: R33.8

## 2016-02-23 LAB — BASIC METABOLIC PANEL
ANION GAP: 8 (ref 5–15)
Anion gap: 8 (ref 5–15)
BUN: 11 mg/dL (ref 6–20)
BUN: 11 mg/dL (ref 6–20)
CALCIUM: 8.9 mg/dL (ref 8.9–10.3)
CALCIUM: 9 mg/dL (ref 8.9–10.3)
CO2: 29 mmol/L (ref 22–32)
CO2: 30 mmol/L (ref 22–32)
Chloride: 90 mmol/L — ABNORMAL LOW (ref 101–111)
Chloride: 90 mmol/L — ABNORMAL LOW (ref 101–111)
Creatinine, Ser: 1.05 mg/dL (ref 0.61–1.24)
Creatinine, Ser: 0.94 mg/dL (ref 0.61–1.24)
GFR calc Af Amer: >60 mL/min (ref >60)
GLUCOSE: 108 mg/dL — AB (ref 65–99)
Glucose, Bld: 116 mg/dL — ABNORMAL HIGH (ref 65–99)
POTASSIUM: 2.7 mmol/L — AB (ref 3.5–5.1)
Potassium: 3.3 mmol/L — ABNORMAL LOW (ref 3.5–5.1)
SODIUM: 127 mmol/L — AB (ref 135–145)
Sodium: 128 mmol/L — ABNORMAL LOW (ref 135–145)

## 2016-02-23 LAB — CBC WITH DIFFERENTIAL/PLATELET
BASOS ABS: 0 10*3/uL (ref 0–0.1)
BASOS PCT: 1 %
EOS ABS: 0.1 10*3/uL (ref 0–0.7)
EOS PCT: 1 %
HCT: 38.1 % — ABNORMAL LOW (ref 40.0–52.0)
Hemoglobin: 13 g/dL (ref 13.0–18.0)
LYMPHS PCT: 17 %
Lymphs Abs: 1.4 10*3/uL (ref 1.0–3.6)
MCH: 30.2 pg (ref 26.0–34.0)
MCHC: 34.1 g/dL (ref 32.0–36.0)
MCV: 88.4 fL (ref 80.0–100.0)
Monocytes Absolute: 0.6 10*3/uL (ref 0.2–1.0)
Monocytes Relative: 6 %
Neutro Abs: 6.6 10*3/uL — ABNORMAL HIGH (ref 1.4–6.5)
Neutrophils Relative %: 75 %
PLATELETS: 140 10*3/uL — AB (ref 150–440)
RBC: 4.31 MIL/uL — AB (ref 4.40–5.90)
RDW: 12.7 % (ref 11.5–14.5)
WBC: 8.7 10*3/uL (ref 3.8–10.6)

## 2016-02-23 LAB — URINALYSIS COMPLETE WITH MICROSCOPIC (ARMC ONLY)
Bacteria, UA: NONE SEEN
SPECIFIC GRAVITY, URINE: 1.012 (ref 1.005–1.030)
Squamous Epithelial / LPF: NONE SEEN

## 2016-02-23 MED ORDER — POTASSIUM CHLORIDE CRYS ER 20 MEQ PO TBCR
20.0000 meq | EXTENDED_RELEASE_TABLET | Freq: Once | ORAL | Status: AC
  Administered 2016-02-23: 20 meq via ORAL
  Filled 2016-02-23: qty 1

## 2016-02-23 NOTE — ED Provider Notes (Signed)
Time Seen: Approximately *2123  I have reviewed the triage notes  Chief Complaint: Hematuria   History of Present Illness: Glen Spencer is a 75 y.o. male who was recently had a Foley catheter for the last 10 days due to urinary outlet obstruction. The catheter was removed yesterday by urology. The patient was still unable to fluid and another Foley catheter was inserted. Patient did fine up until early this morning he started having bright red blood in the bag with clots his wife describes an episode of washing it with distilled water and cleared some of the clots and continued to have now dark maroon colored bleeding in the back. Patient denies any abdominal pain. He denies any feelings of lightheadedness etc. He is not currently on any anticoagulation therapy he does have a history of thrombocytopenia, and hypokalemia.   Past Medical History:  Diagnosis Date  . Anemia   . Enlarged prostate with urinary retention   . Hypertension     Patient Active Problem List   Diagnosis Date Noted  . Acute renal failure (ARF) (HCC) 02/12/2016  . Urinary retention 02/12/2016  . Essential hypertension with goal blood pressure less than 130/85 02/12/2016  . Hypokalemia 02/12/2016  . Thrombocytopenia (HCC) 02/12/2016  . Flank pain 02/12/2016    Past Surgical History:  Procedure Laterality Date  . COLONOSCOPY WITH PROPOFOL N/A 08/08/2015   Procedure: COLONOSCOPY WITH PROPOFOL;  Surgeon: Wallace CullensPaul Y Oh, MD;  [facility];  Service: Gastroenterology;  Laterality: N/A;  . ESOPHAGOGASTRODUODENOSCOPY (EGD) WITH PROPOFOL N/A 08/08/2015   Procedure: ESOPHAGOGASTRODUODENOSCOPY (EGD) WITH PROPOFOL;  Surgeon: Wallace CullensPaul Y Oh, MD;  [facility];  Service: Gastroenterology;  Laterality: N/A;    Past Surgical History:  Procedure Laterality Date  . COLONOSCOPY WITH PROPOFOL N/A 08/08/2015   Procedure: COLONOSCOPY WITH PROPOFOL;  Surgeon: Wallace CullensPaul Y Oh, MD;  [facility];  Service:  Gastroenterology;  Laterality: N/A;  . ESOPHAGOGASTRODUODENOSCOPY (EGD) WITH PROPOFOL N/A 08/08/2015   Procedure: ESOPHAGOGASTRODUODENOSCOPY (EGD) WITH PROPOFOL;  Surgeon: Wallace CullensPaul Y Oh, MD;  [facility];  Service: Gastroenterology;  Laterality: N/A;    Current Outpatient Rx  . Order #: [id number] Class: Normal  . Order #: [id number] Class: Historical Med  . Order #: [id number] Class: Historical Med  . Order #: [id number] Class: Historical Med    Allergies:  Review of patient's allergies indicates no known allergies.  Family History: Family History  Problem Relation Age of Onset  . Prostate cancer Neg Hx   . Bladder Cancer Neg Hx   . Kidney cancer Neg Hx     Social History: Social History  Substance Use Topics  . Smoking status: Never Smoker  . Smokeless tobacco: Never Used  . Alcohol use No     Review of Systems:   10 point review of systems was performed and was otherwise negative:  Constitutional: No fever Eyes: No visual disturbances ENT: No sore throat, ear pain Cardiac: No chest pain Respiratory: No shortness of breath, wheezing, or stridor Abdomen: No abdominal pain, no vomiting, No diarrhea Endocrine: No weight loss, No night sweats Extremities: No peripheral edema, cyanosis Skin: No rashes, easy bruising Neurologic: No focal weakness, trouble with speech or swollowing Urologic: No dysuria,Gross hematuria without urinary frequency   Physical Exam:  ED Triage Vitals [02/23/16 1916]  Enc Vitals Group     BP (!) 153/87     Pulse Rate 71     Resp 16     Temp 97.9 F (36.6 C)  Temp Source Oral     SpO2 100 %     Weight 212 lb (96.2 kg)     Height 5\' 10"  (1.778 m)     Head Circumference      Peak Flow      Pain Score 0     Pain Loc      Pain Edu?      Excl. in GC?     General: Awake , Alert , and Oriented times 3; GCS 15 Head: Normal cephalic , atraumatic Eyes: Pupils equal , round, reactive to light Nose/Throat: No nasal drainage, patent  upper airway without erythema or exudate.  Neck: Supple, Full range of motion, No anterior adenopathy or palpable thyroid masses Lungs: Clear to ascultation without wheezes , rhonchi, or rales Heart: Regular rate, regular rhythm without murmurs , gallops , or rubs Abdomen: Soft, non tender without rebound, guarding , or rigidity; bowel sounds positive and symmetric in all 4 quadrants. No organomegaly .        Extremities: 2 plus symmetric pulses. No edema, clubbing or cyanosis Neurologic: normal ambulation, Motor symmetric without deficits, sensory intact Skin: warm, dry, no rashes Examination of the Foley bag shows what appears to be maroon colored urine without any clots.  Labs:   All laboratory work was reviewed including any pertinent negatives or positives listed below:  Labs Reviewed  CBC WITH DIFFERENTIAL/PLATELET - Abnormal; Notable for the following:       Result Value   RBC 4.31 (*)    HCT 38.1 (*)    Platelets 140 (*)    Neutro Abs 6.6 (*)    All other components within normal limits  BASIC METABOLIC PANEL - Abnormal; Notable for the following:    Sodium 127 (*)    Potassium 2.7 (*)    Chloride 90 (*)    Glucose, Bld 116 (*)    All other components within normal limits  URINALYSIS COMPLETEWITH MICROSCOPIC ([facility] ONLY) - Abnormal; Notable for the following:    Color, Urine RED (*)    APPearance CLOUDY (*)    Glucose, UA   (*)    Value: TEST NOT REPORTED DUE TO COLOR INTERFERENCE OF URINE PIGMENT   Bilirubin Urine   (*)    Value: TEST NOT REPORTED DUE TO COLOR INTERFERENCE OF URINE PIGMENT   Ketones, ur   (*)    Value: TEST NOT REPORTED DUE TO COLOR INTERFERENCE OF URINE PIGMENT   Hgb urine dipstick   (*)    Value: TEST NOT REPORTED DUE TO COLOR INTERFERENCE OF URINE PIGMENT   Protein, ur   (*)    Value: TEST NOT REPORTED DUE TO COLOR INTERFERENCE OF URINE PIGMENT   Nitrite   (*)    Value: TEST NOT REPORTED DUE TO COLOR INTERFERENCE OF URINE PIGMENT   Leukocytes, UA    (*)    Value: TEST NOT REPORTED DUE TO COLOR INTERFERENCE OF URINE PIGMENT   All other components within normal limits  URINE CULTURE  BASIC METABOLIC PANEL  Patient's blood work was rechecked and he had a normal potassium and his sodium is 128.   ED Course: Patient had a bedside bladder scan which have showed no residual urine. His Foley continues to drain with maroon old appearing blood. His catheter bag was emptied on 2 occasions here in emergency department he appears to have normal renal function and no complaints of abdominal pain. Felt that we did not need to put in a 3-way Foley to  irrigate at this time since its adequately draining and gave his wife supplies to flush the catheter as needed. Otherwise hemodynamically stable and his platelets are within normal limits and he has no coagulopathies per history other than low platelets. Patient's CBC is normal and his renal function is fine. I did culture his urine but felt we did not need to supply him with antibiotics at this time. He has no symptoms consistent with hyponatremia. Clinical Course     Assessment:  Traumatic hematuria History of urinary outlet obstruction     Plan: Outpatient Patient was advised to return immediately if condition worsens. Patient was advised to follow up with their primary care physician or other specialized physicians involved in their outpatient care. The patient and/or family member/power of attorney had laboratory results reviewed at the bedside. All questions and concerns were addressed and appropriate discharge instructions were distributed by the nursing staff.             Jennye Moccasin, MD 02/23/16 [phone]

## 2016-02-23 NOTE — Discharge Instructions (Signed)
Please contact your urologist in the morning for further outpatient follow-up as soon as possible. Advised them that the urine appeared to be continued to drain and we felt we did not need to change her catheter at this time.  Please also catheter if needed. Return here to emergency department especially for continued lower abdominal pain, decreased drainage from the Foley catheter site, easy bruising or any other new concerns.  Please return immediately if condition worsens. Please contact her primary physician or the physician you were given for referral. If you have any specialist physicians involved in her treatment and plan please also contact them. Thank you for using [facility].

## 2016-02-23 NOTE — Telephone Encounter (Signed)
appt changed per dr. Apolinar Junesbrandon

## 2016-02-23 NOTE — ED Notes (Signed)
Report given to Jordan,RN

## 2016-02-23 NOTE — ED Notes (Signed)
Pt leg bag emptied prior to D/C. 300 ml of bloody urine emptied out of bag. Pt leg bag reattached to R leg. Pt stated comfort.

## 2016-02-23 NOTE — Telephone Encounter (Signed)
Patient and patient's wife called stating that he was seeing blood in the leg bag and some clots.  Patient's wife was notified this is normal, she was asked what color the urine was and she states it is like red kool aid with clots. She was notified that this normal based on his history and having the cath replaced yesterday, if the clots stop the cath from draining they are to call the office and we will have them come in for a irrigation teaching. Patient's wife verbalized agreement with this plan, she also states patient is having trouble with constipation. She was notified for him to try Miralax, colace or Magcitrate to help with this.

## 2016-02-23 NOTE — ED Triage Notes (Signed)
Had foley catheter placed 10 days ago.  Catheter removed yesterday by urology. Patient was unable to void after catheter removed, foley replaced.  Overnight urine became red.  Patient called urology and is here today to have catheter irrigated, because urine output has decreased.  Initially urine output became less last night and has continued this evening.

## 2016-02-25 LAB — URINE CULTURE: Culture: NO GROWTH

## 2016-02-29 ENCOUNTER — Other Ambulatory Visit: Payer: Medicare Other | Admitting: Urology

## 2016-02-29 ENCOUNTER — Ambulatory Visit: Payer: Medicare Other

## 2016-02-29 ENCOUNTER — Ambulatory Visit: Payer: Medicare Other | Admitting: Urology

## 2016-02-29 VITALS — BP 137/79 | HR 76 | Ht 70.0 in | Wt 217.0 lb

## 2016-02-29 DIAGNOSIS — N401 Enlarged prostate with lower urinary tract symptoms: Secondary | ICD-10-CM

## 2016-02-29 DIAGNOSIS — R339 Retention of urine, unspecified: Secondary | ICD-10-CM | POA: Diagnosis not present

## 2016-02-29 DIAGNOSIS — N138 Other obstructive and reflux uropathy: Secondary | ICD-10-CM

## 2016-02-29 MED ORDER — CIPROFLOXACIN HCL 500 MG PO TABS
500.0000 mg | ORAL_TABLET | Freq: Once | ORAL | Status: AC
  Administered 2016-02-29: 500 mg via ORAL

## 2016-02-29 MED ORDER — LIDOCAINE HCL 2 % EX GEL
1.0000 "application " | Freq: Once | CUTANEOUS | Status: AC
  Administered 2016-02-29: 1 via URETHRAL

## 2016-02-29 NOTE — Progress Notes (Signed)
   02/29/16  CC:  Chief Complaint  Patient presents with  . 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  He's had some issues with small blood clots since his last catheter was replaced but has cleared up nicely.  Blood pressure 137/79, pulse 76, height 5\' 10"  (1.778 m), weight 217 lb (98.4 kg). NED. A&Ox3.   No respiratory distress   Abd soft, NT, ND Normal phallus with bilateral descended testicles.    Cystoscopy Procedure Note  Patient identification was confirmed, informed consent was obtained, and patient was prepped using Betadine solution.  Lidocaine jelly was administered per urethral meatus.    Preoperative abx where received prior to procedure.     Pre-Procedure: - Inspection reveals a normal caliber ureteral meatus.  Procedure: The flexible cystoscope was introduced without difficulty - No urethral strictures/lesions are present. - Enlarged prostate with severe trilobar coaptation, at least 7-8 cm prostatic length - Elevated bladder neck  - Bilateral ureteral orifices identified today due to distortion from prostate  - Bladder mucosa revealed significant catheter cystitis but no papillary tumors - No bladder stones - Heavy bladder trabeculation with saccules present  Retroflexion shows with ball valving appearance large median lobe  Post-Procedure: - Patient tolerated the procedure well  Assessment/ Plan:  1. Urinary retention Failed VT today after cysto Catheter replaced - ciprofloxacin (CIPRO) tablet 500 mg; Take 1 tablet (500 mg total) by mouth once. - lidocaine (XYLOCAINE) 2 % jelly 1 application; Place 1 application into the urethra once.  2. BPH with obstruction/lower urinary tract symptoms Continue flomax At this point, I would recommend surgical intervention for his very large prostate which is likely the cause of his retention He would be a good candidate for open simple  prostatectomy versus robotic simple prostatectomy versus holmium laser enucleation of the prostate Patient would like to discuss this further with his wife present. We'll reschedule him for next week when she is available.  Return in about 1 week (around 03/07/2016) for discuss surgery.

## 2016-03-02 ENCOUNTER — Telehealth: Payer: Self-pay | Admitting: Urology

## 2016-03-02 DIAGNOSIS — N401 Enlarged prostate with lower urinary tract symptoms: Secondary | ICD-10-CM

## 2016-03-02 NOTE — Telephone Encounter (Signed)
Pt would like a refill on Finasteride.  [facility] [place]  Please give pt a call [phone]

## 2016-03-02 NOTE — Telephone Encounter (Signed)
Please address, we did not prescribe for patient thanks

## 2016-03-05 MED ORDER — FINASTERIDE 5 MG PO TABS: 5.0000 mg | ORAL_TABLET | Freq: Every day | ORAL | 3 refills | Status: DC

## 2016-03-05 NOTE — Telephone Encounter (Signed)
You did not prescribed finasteride. F/u is 03/09/16. Do you want me to give refills or wait until appt?

## 2016-03-05 NOTE — Telephone Encounter (Signed)
Refill sent to pharmacy.   

## 2016-03-05 NOTE — Telephone Encounter (Signed)
Refill is fine.    Vanna ScotlandAshley Rommel Hogston, MD

## 2016-03-06 NOTE — Telephone Encounter (Signed)
Patient called the office this afternoon with complaint of his feet swelling.  He would like some advise on how to make the swelling go down and if it is normal.    He is scheduled for an appt on Friday with Dr. Apolinar JunesBrandon.

## 2016-03-06 NOTE — Telephone Encounter (Signed)
Please have the patient contact his PCP.    Vanna ScotlandAshley Oneka Parada, MD

## 2016-03-07 NOTE — Telephone Encounter (Signed)
Spoke with pt in reference to leg swelling. Made aware to call PCP. Pt voiced understanding.

## 2016-03-08 DIAGNOSIS — R338 Other retention of urine: Secondary | ICD-10-CM

## 2016-03-08 DIAGNOSIS — N401 Enlarged prostate with lower urinary tract symptoms: Secondary | ICD-10-CM | POA: Insufficient documentation

## 2016-03-09 ENCOUNTER — Other Ambulatory Visit: Payer: Self-pay | Admitting: Radiology

## 2016-03-09 ENCOUNTER — Telehealth: Payer: Self-pay | Admitting: Radiology

## 2016-03-09 ENCOUNTER — Encounter: Payer: Self-pay | Admitting: Urology

## 2016-03-09 ENCOUNTER — Telehealth: Payer: Self-pay | Admitting: Urology

## 2016-03-09 ENCOUNTER — Ambulatory Visit (INDEPENDENT_AMBULATORY_CARE_PROVIDER_SITE_OTHER): Payer: Medicare Other | Admitting: Urology

## 2016-03-09 VITALS — BP 112/71 | HR 76 | Ht 70.0 in | Wt 213.3 lb

## 2016-03-09 DIAGNOSIS — N401 Enlarged prostate with lower urinary tract symptoms: Secondary | ICD-10-CM | POA: Diagnosis not present

## 2016-03-09 DIAGNOSIS — N179 Acute kidney failure, unspecified: Secondary | ICD-10-CM

## 2016-03-09 DIAGNOSIS — R339 Retention of urine, unspecified: Secondary | ICD-10-CM | POA: Diagnosis not present

## 2016-03-09 DIAGNOSIS — N189 Chronic kidney disease, unspecified: Secondary | ICD-10-CM

## 2016-03-09 DIAGNOSIS — R338 Other retention of urine: Principal | ICD-10-CM

## 2016-03-09 NOTE — Telephone Encounter (Signed)
Mr. Glen Spencer called saying he has an upcoming surgery that's scheduled and he's wondering if he needs to stop taking Tamsulosin and Finasteride because of this. Please give him a phone call regarding this.   Pt's ph# [phone] Thank you.

## 2016-03-09 NOTE — Telephone Encounter (Signed)
Advised pt per Dr Ronne BinningMcKenzie that pt should continue finasteride & tamsulosin prior to surgery. Dr Apolinar JunesBrandon will address medications again after surgery. Pt voices understanding.

## 2016-03-09 NOTE — Telephone Encounter (Signed)
Notified pt of surgery scheduled with Dr Apolinar JunesBrandon on 03/26/16, pre-admit testing appt on 03/14/16 @9 :45 & to call Friday prior to surgery for arrival time to SDS. Pt voices understanding.

## 2016-03-09 NOTE — Progress Notes (Signed)
03/09/2016 9:09 AM   Glen Spencer E Hellinger 03-29-1941 [id number]  Referring provider: Dorothey Basemanavid Bronstein, MD [phone] [place], [place] [id number]  Chief Complaint  Patient presents with  . Follow-up    discuss surgery    HPI: 75 year old male who presents to the office for further evaluation of severe urinary retention with bilateral hydroureteronephrosis, acute renal failure requiring hospitalization s/p failed VT x 2.  He returns today accompanied by his wife to discuss further evaluation/surgical intervention for his chronic outlet obstruction.  He has a known history of urinary issues was treated for urinary tract infection in 09/2015.  He was started on Flomax his PCP.  Since then, he has had difficulty urinating and emptying his bladder. Shortly prior to his admission, he was having urinary leakage and incontinence.  Upon presenting to the emergency room, a Foley catheter was placed in greater than 2.5 L was drained.  Creatinine on admission was 2.87 and improved to 1.7 by the time of discharge. It appears that his baseline is around 1.1.  His Foley catheter has remained in place since discharge.He has failed VT x 2.  He is currently on Flomax and finasteride.  Most recent PSA 2.23 on 07/07/2015.  Rectal exam enlarged, otherwise unremarkable.    He denies a personal history of urinary retention. He is somewhat of a difficult historian turned to recall his baseline urinary symptoms prior to June. He thinks that these issues have been slowly worsening over time.  Cystoscopy showed a massively enlarged prostate, 8 sentinel meter prostatic length with severe trilobar coaptation, heavy bladder trabeculation with saccules, trigone difficult to identify due to distortion from median lobe.    PMH: Past Medical History:  Diagnosis Date  . Anemia   . Enlarged prostate with urinary retention   . Hypertension     Surgical History: Past Surgical History:  Procedure Laterality Date  .  COLONOSCOPY WITH PROPOFOL N/A 08/08/2015   Procedure: COLONOSCOPY WITH PROPOFOL;  Surgeon: Wallace CullensPaul Y Oh, MD;  [facility];  Service: Gastroenterology;  Laterality: N/A;  . ESOPHAGOGASTRODUODENOSCOPY (EGD) WITH PROPOFOL N/A 08/08/2015   Procedure: ESOPHAGOGASTRODUODENOSCOPY (EGD) WITH PROPOFOL;  Surgeon: Wallace CullensPaul Y Oh, MD;  [facility];  Service: Gastroenterology;  Laterality: N/A;    Home Medications:    Medication List       Accurate as of 03/09/16  9:09 AM. Always use your most recent med list.          finasteride 5 MG tablet Commonly known as:  PROSCAR Take 1 tablet (5 mg total) by mouth daily.   pantoprazole 40 MG tablet Commonly known as:  PROTONIX Take 40 mg by mouth 2 (two) times daily. Take 30 to 45 minutes prior to first meal of the day   tamsulosin 0.4 MG Caps capsule Commonly known as:  FLOMAX Take 0.4 mg by mouth daily. Take 30 minutes after same meal each day   valsartan-hydrochlorothiazide 160-25 MG tablet Commonly known as:  DIOVAN-HCT Take 1 tablet by mouth daily.       Allergies: No Known Allergies  Family History: Family History  Problem Relation Age of Onset  . Prostate cancer Neg Hx   . Bladder Cancer Neg Hx   . Kidney cancer Neg Hx     Social History:  reports that he has never smoked. He has never used smokeless tobacco. He reports that he does not drink alcohol or use drugs.  ROS: 12 point review systems was performed. Otherwise negative other than for  lower extremity swelling and urinary issues as above.  Physical Exam: BP 112/71 (BP Location: Left Arm, Patient Position: Sitting, Cuff Size: Normal)   Pulse 76   Ht 5' 10" (1.778 m)   Wt 213 lb 4.8 oz (96.8 kg)   BMI 30.61 kg/m   Constitutional:  Alert and oriented, No acute distress.  Accompanied by wife today. HEENT: [place] AT, moist mucus membranes.  Trachea midline, no masses. Cardiovascular: No clubbing, cyanosis, or edema.  RRR. Respiratory: Normal respiratory  effort, no increased work of breathing. CTAB. GI: Abdomen is soft, nontender, nondistended, no abdominal masses Skin: No rashes, bruises or suspicious lesions. Neurologic: Grossly intact, no focal deficits, moving all 4 extremities. Psychiatric: Normal mood and affect.  Laboratory Data: Lab Results  Component Value Date   WBC 8.7 02/23/2016   HGB 13.0 02/23/2016   HCT 38.1 (L) 02/23/2016   MCV 88.4 02/23/2016   PLT 140 (L) 02/23/2016    Lab Results  Component Value Date   CREATININE 0.94 02/23/2016    Lab Results  Component Value Date   PSA 3.52 02/12/2016    Pertinent Imaging: CT scan from 02/12/2016 reviews personally today. He does have a massively distended bladder with bilateral hydronephrosis. His prostate is significantly enlarged, calculated volume 197 cc.  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  Assessment & Plan:    1. Urinary retention Continue Flomax and finasteride Failed voiding trial 2  2. BPH with obstruction/lower urinary tract symptoms Massively enlarged prostate- 200 cc based on CT scan Suspect chronic incomplete bladder emptying based on the degree of bladder dilation, severe trabeculation He may have a component of neurogenic bladder due to chronic outlet obstruction- ideally would recommend urodynamics to further evaluate bladder function prior to outlet procedure, however, given that he is unable to generate a contraction, I feel that the utility of this may be limited  Options moving forward discussed in detail. These include chronic indwelling Foley catheter, clean intermittent catheterization, or an outlet procedure. Given the size of his gland, I feel that he would be a good candidate for either robotic simple prostatectomy, open simple prostatectomy, or holmium laser enucleation of the prostate. The procedure including risk and benefits of each were discussed in detail. His most interested holmium laser  enucleation of the prostate.  Risks including bleeding, infection, need for Foley catheter, failure of the procedure, increased urinary frequency/urgency, urge incontinence, stress incontinence, retrograde ejaculation were discussed. Injury to the bladder and prostate were also discussed in detail. All his questions were answered.  He understands that given the chronic nature of his outlet obstruction, it is a chance of this procedure may fail. He does wife understand these risks and would like to proceed.   3. Acute renal failure superimposed on chronic kidney disease, unspecified CKD stage, unspecified acute renal failure type (HCC) Secondary to outlet obstruction Plan to recheck labs with preop labs  Schedule holmium laser enucleation of the prostate  Jackquline Branca, MD  [facility] [address], [address] [place], [place] [address] [phone]  I spent 25 min with this patient of which greater than 50% was spent in counseling and coordination of care with the patient.    

## 2016-03-14 ENCOUNTER — Encounter
Admission: RE | Admit: 2016-03-14 | Discharge: 2016-03-14 | Disposition: A | Discharge status: Home / Self Care | Payer: Medicare Other | Source: Ambulatory Visit | Attending: Urology | Admitting: Urology

## 2016-03-14 DIAGNOSIS — N178 Other acute kidney failure: Secondary | ICD-10-CM | POA: Insufficient documentation

## 2016-03-14 DIAGNOSIS — Z01812 Encounter for preprocedural laboratory examination: Secondary | ICD-10-CM | POA: Insufficient documentation

## 2016-03-14 DIAGNOSIS — N189 Chronic kidney disease, unspecified: Secondary | ICD-10-CM | POA: Insufficient documentation

## 2016-03-14 DIAGNOSIS — R339 Retention of urine, unspecified: Secondary | ICD-10-CM | POA: Insufficient documentation

## 2016-03-14 DIAGNOSIS — N138 Other obstructive and reflux uropathy: Secondary | ICD-10-CM | POA: Diagnosis not present

## 2016-03-14 DIAGNOSIS — N401 Enlarged prostate with lower urinary tract symptoms: Secondary | ICD-10-CM | POA: Diagnosis not present

## 2016-03-14 HISTORY — DX: Gastro-esophageal reflux disease without esophagitis: K21.9

## 2016-03-14 HISTORY — DX: Unspecified asthma, uncomplicated: J45.909

## 2016-03-14 LAB — BASIC METABOLIC PANEL
Anion gap: 7 (ref 5–15)
BUN: 10 mg/dL (ref 6–20)
CHLORIDE: 98 mmol/L — AB (ref 101–111)
CO2: 30 mmol/L (ref 22–32)
CREATININE: 0.96 mg/dL (ref 0.61–1.24)
Calcium: 9.4 mg/dL (ref 8.9–10.3)
GFR calc Af Amer: >60 mL/min (ref >60)
GFR calc non Af Amer: >60 mL/min (ref >60)
GLUCOSE: 82 mg/dL (ref 65–99)
POTASSIUM: 3.1 mmol/L — AB (ref 3.5–5.1)
Sodium: 135 mmol/L (ref 135–145)

## 2016-03-14 NOTE — Patient Instructions (Signed)
  Your procedure is scheduled on: March 26, 2016 (Monday) Report to Same Day Surgery 2nd floor Medical Mall To find out your arrival time please call [phone] between 1PM - 3PM on March 23, 2016 (Friday)  Remember: Instructions that are not followed completely may result in serious medical risk, up to and including death, or upon the discretion of your surgeon and anesthesiologist your surgery may need to be rescheduled.    _x___ 1. Do not eat food or drink liquids after midnight. No gum chewing or hard candies.     __x__ 2. No Alcohol for 24 hours before or after surgery.   __x__3. No Smoking for 24 prior to surgery.   ____  4. Bring all medications with you on the day of surgery if instructed.    __x__ 5. Notify your doctor if there is any change in your medical condition     (cold, fever, infections).     Do not wear jewelry, make-up, hairpins, clips or nail polish.  Do not wear lotions, powders, or perfumes. You may wear deodorant.  Do not shave 48 hours prior to surgery. Men may shave face and neck.  Do not bring valuables to the hospital.    [facility] is not responsible for any belongings or valuables.               Contacts, dentures or bridgework may not be worn into surgery.  Leave your suitcase in the car. After surgery it may be brought to your room.  For patients admitted to the hospital, discharge time is determined by your treatment team.   Patients discharged the day of surgery will not be allowed to drive home.    Please read over the following fact sheets that you were given:   [facility] Preparing for Surgery and or MRSA Information   _x___ Take these medicines the morning of surgery with A SIP OF WATER:    1. Pantoprazole  2.  3.  4.  5.  6.  ____Fleets enema or Magnesium Citrate as directed.   ___ Use CHG Soap or sage wipes as directed on instruction sheet   ____ Use inhalers on the day of surgery and bring to hospital day of  surgery  ____ Stop metformin 2 days prior to surgery    ____ Take 1/2 of usual insulin dose the night before surgery and none on the morning of           surgery.   _x___ Stop aspirin or coumadin, or plavix (NO ASPIRIN)  x__ Stop Anti-inflammatories such as Advil, Aleve, Ibuprofen, Motrin, Naproxen,          Naprosyn, Goodies powders or aspirin products. Ok to take Tylenol.   ____ Stop supplements until after surgery.    ____ Bring C-Pap to the hospital.

## 2016-03-15 ENCOUNTER — Ambulatory Visit: Payer: Medicare Other | Admitting: Urology

## 2016-03-15 LAB — URINE CULTURE

## 2016-03-15 NOTE — Pre-Procedure Instructions (Signed)
EKG READ BY DR P CARROLL AND OK

## 2016-03-16 NOTE — Pre-Procedure Instructions (Signed)
Urine culture results sent to Dr. Apolinar JunesBrandon for review.  Questioned if wanted it recollected?

## 2016-03-19 NOTE — Pre-Procedure Instructions (Signed)
CALL FROM AMY AT DR Delana MeyerBRANDON'S PATIENT STARTED ON KT SUPP BY DR Terance HartBRONSTEIN

## 2016-03-26 ENCOUNTER — Emergency Department (HOSPITAL_COMMUNITY)
Admission: EM | Admit: 2016-03-26 | Discharge: 2016-03-26 | Disposition: A | Discharge status: Home / Self Care | Payer: Medicare Other | Attending: Emergency Medicine | Admitting: Emergency Medicine

## 2016-03-26 ENCOUNTER — Encounter (HOSPITAL_COMMUNITY): Payer: Self-pay

## 2016-03-26 ENCOUNTER — Ambulatory Visit: Payer: Medicare Other | Admitting: Anesthesiology

## 2016-03-26 ENCOUNTER — Encounter
Admission: RE | Disposition: A | Discharge status: Home / Self Care | Payer: Self-pay | Source: Ambulatory Visit | Attending: Urology

## 2016-03-26 ENCOUNTER — Ambulatory Visit
Admission: RE | Admit: 2016-03-26 | Discharge: 2016-03-26 | Disposition: A | Discharge status: Home / Self Care | Payer: Medicare Other | Source: Ambulatory Visit | Attending: Urology | Admitting: Urology

## 2016-03-26 DIAGNOSIS — I10 Essential (primary) hypertension: Secondary | ICD-10-CM | POA: Insufficient documentation

## 2016-03-26 DIAGNOSIS — E669 Obesity, unspecified: Secondary | ICD-10-CM | POA: Insufficient documentation

## 2016-03-26 DIAGNOSIS — R338 Other retention of urine: Secondary | ICD-10-CM | POA: Insufficient documentation

## 2016-03-26 DIAGNOSIS — N189 Chronic kidney disease, unspecified: Secondary | ICD-10-CM | POA: Insufficient documentation

## 2016-03-26 DIAGNOSIS — Z79899 Other long term (current) drug therapy: Secondary | ICD-10-CM | POA: Diagnosis not present

## 2016-03-26 DIAGNOSIS — N401 Enlarged prostate with lower urinary tract symptoms: Secondary | ICD-10-CM | POA: Diagnosis present

## 2016-03-26 DIAGNOSIS — N1339 Other hydronephrosis: Secondary | ICD-10-CM | POA: Diagnosis not present

## 2016-03-26 DIAGNOSIS — R55 Syncope and collapse: Secondary | ICD-10-CM | POA: Insufficient documentation

## 2016-03-26 DIAGNOSIS — Z6829 Body mass index (BMI) 29.0-29.9, adult: Secondary | ICD-10-CM | POA: Insufficient documentation

## 2016-03-26 DIAGNOSIS — N320 Bladder-neck obstruction: Secondary | ICD-10-CM | POA: Diagnosis not present

## 2016-03-26 DIAGNOSIS — D631 Anemia in chronic kidney disease: Secondary | ICD-10-CM | POA: Insufficient documentation

## 2016-03-26 DIAGNOSIS — N138 Other obstructive and reflux uropathy: Secondary | ICD-10-CM | POA: Insufficient documentation

## 2016-03-26 DIAGNOSIS — J45909 Unspecified asthma, uncomplicated: Secondary | ICD-10-CM | POA: Insufficient documentation

## 2016-03-26 DIAGNOSIS — I129 Hypertensive chronic kidney disease with stage 1 through stage 4 chronic kidney disease, or unspecified chronic kidney disease: Secondary | ICD-10-CM | POA: Diagnosis not present

## 2016-03-26 DIAGNOSIS — K219 Gastro-esophageal reflux disease without esophagitis: Secondary | ICD-10-CM | POA: Insufficient documentation

## 2016-03-26 DIAGNOSIS — N179 Acute kidney failure, unspecified: Secondary | ICD-10-CM | POA: Insufficient documentation

## 2016-03-26 HISTORY — PX: HOLEP-LASER ENUCLEATION OF THE PROSTATE WITH MORCELLATION: SHX6641

## 2016-03-26 HISTORY — PX: HOLMIUM LASER APPLICATION: SHX5852

## 2016-03-26 LAB — BASIC METABOLIC PANEL
ANION GAP: 7 (ref 5–15)
BUN: 10 mg/dL (ref 6–20)
CHLORIDE: 102 mmol/L (ref 101–111)
CO2: 24 mmol/L (ref 22–32)
Calcium: 8.6 mg/dL — ABNORMAL LOW (ref 8.9–10.3)
Creatinine, Ser: 1.36 mg/dL — ABNORMAL HIGH (ref 0.61–1.24)
GFR calc Af Amer: 58 mL/min — ABNORMAL LOW (ref >60)
GFR calc non Af Amer: 50 mL/min — ABNORMAL LOW (ref >60)
GLUCOSE: 145 mg/dL — AB (ref 65–99)
POTASSIUM: 3.9 mmol/L (ref 3.5–5.1)
Sodium: 133 mmol/L — ABNORMAL LOW (ref 135–145)

## 2016-03-26 LAB — CBC WITH DIFFERENTIAL/PLATELET
BASOS ABS: 0 10*3/uL (ref 0.0–0.1)
Basophils Relative: 0 %
Eosinophils Absolute: 0 10*3/uL (ref 0.0–0.7)
Eosinophils Relative: 0 %
HEMATOCRIT: 32.3 % — AB (ref 39.0–52.0)
Hemoglobin: 10.8 g/dL — ABNORMAL LOW (ref 13.0–17.0)
LYMPHS ABS: 1 10*3/uL (ref 0.7–4.0)
LYMPHS PCT: 10 %
MCH: 29.7 pg (ref 26.0–34.0)
MCHC: 33.4 g/dL (ref 30.0–36.0)
MCV: 88.7 fL (ref 78.0–100.0)
Monocytes Absolute: 0.3 10*3/uL (ref 0.1–1.0)
Monocytes Relative: 3 %
NEUTROS ABS: 8.7 10*3/uL — AB (ref 1.7–7.7)
Neutrophils Relative %: 87 %
Platelets: 218 10*3/uL (ref 150–400)
RBC: 3.64 MIL/uL — AB (ref 4.22–5.81)
RDW: 13.3 % (ref 11.5–15.5)
WBC: 10.1 10*3/uL (ref 4.0–10.5)

## 2016-03-26 LAB — POCT I-STAT 4, (NA,K, GLUC, HGB,HCT)
Glucose, Bld: 90 mg/dL (ref 65–99)
HEMATOCRIT: 43 % (ref 39.0–52.0)
HEMOGLOBIN: 14.6 g/dL (ref 13.0–17.0)
Potassium: 3.7 mmol/L (ref 3.5–5.1)
Sodium: 136 mmol/L (ref 135–145)

## 2016-03-26 SURGERY — ENUCLEATION, PROSTATE, USING LASER, WITH MORCELLATION
Anesthesia: General | Site: Prostate | Wound class: Clean Contaminated

## 2016-03-26 MED ORDER — PROPOFOL 10 MG/ML IV BOLUS
INTRAVENOUS | Status: DC | PRN
  Administered 2016-03-26: 160 mg via INTRAVENOUS

## 2016-03-26 MED ORDER — ONDANSETRON HCL 4 MG/2ML IJ SOLN
INTRAMUSCULAR | Status: DC | PRN
  Administered 2016-03-26: 4 mg via INTRAVENOUS

## 2016-03-26 MED ORDER — SODIUM CHLORIDE 0.9 % IV BOLUS (SEPSIS)
1000.0000 mL | Freq: Once | INTRAVENOUS | Status: AC
  Administered 2016-03-26: 1000 mL via INTRAVENOUS

## 2016-03-26 MED ORDER — OXYCODONE HCL 5 MG/5ML PO SOLN: 5.0000 mg | Freq: Once | ORAL | Status: DC | PRN

## 2016-03-26 MED ORDER — PHENYLEPHRINE HCL 10 MG/ML IJ SOLN
INTRAMUSCULAR | Status: DC | PRN
  Administered 2016-03-26: 100 ug via INTRAVENOUS
  Administered 2016-03-26: 200 ug via INTRAVENOUS
  Administered 2016-03-26 (×7): 100 ug via INTRAVENOUS
  Administered 2016-03-26: 200 ug via INTRAVENOUS
  Administered 2016-03-26 (×6): 100 ug via INTRAVENOUS

## 2016-03-26 MED ORDER — MEPERIDINE HCL 25 MG/ML IJ SOLN: 6.2500 mg | INTRAMUSCULAR | Status: DC | PRN

## 2016-03-26 MED ORDER — DOCUSATE SODIUM 100 MG PO CAPS: 100.0000 mg | ORAL_CAPSULE | Freq: Two times a day (BID) | ORAL | 0 refills | Status: DC

## 2016-03-26 MED ORDER — EPHEDRINE SULFATE 50 MG/ML IJ SOLN
INTRAMUSCULAR | Status: DC | PRN
  Administered 2016-03-26 (×2): 10 mg via INTRAVENOUS

## 2016-03-26 MED ORDER — FENTANYL CITRATE (PF) 100 MCG/2ML IJ SOLN
INTRAMUSCULAR | Status: AC
  Administered 2016-03-26: 25 ug via INTRAVENOUS
  Filled 2016-03-26: qty 2

## 2016-03-26 MED ORDER — ROCURONIUM BROMIDE 100 MG/10ML IV SOLN
INTRAVENOUS | Status: DC | PRN
  Administered 2016-03-26: 20 mg via INTRAVENOUS
  Administered 2016-03-26 (×2): 10 mg via INTRAVENOUS
  Administered 2016-03-26: 40 mg via INTRAVENOUS
  Administered 2016-03-26: 20 mg via INTRAVENOUS

## 2016-03-26 MED ORDER — FUROSEMIDE 10 MG/ML IJ SOLN
INTRAMUSCULAR | Status: DC | PRN
  Administered 2016-03-26 (×2): 10 mg via INTRAMUSCULAR

## 2016-03-26 MED ORDER — OXYCODONE HCL 5 MG PO TABS: 5.0000 mg | ORAL_TABLET | Freq: Once | ORAL | Status: DC | PRN

## 2016-03-26 MED ORDER — LACTATED RINGERS IV SOLN
INTRAVENOUS | Status: DC
  Administered 2016-03-26 (×4): via INTRAVENOUS

## 2016-03-26 MED ORDER — PROMETHAZINE HCL 25 MG/ML IJ SOLN: 6.2500 mg | INTRAMUSCULAR | Status: DC | PRN

## 2016-03-26 MED ORDER — LIDOCAINE HCL (CARDIAC) 20 MG/ML IV SOLN
INTRAVENOUS | Status: DC | PRN
  Administered 2016-03-26: 100 mg via INTRAVENOUS

## 2016-03-26 MED ORDER — FENTANYL CITRATE (PF) 100 MCG/2ML IJ SOLN
25.0000 ug | INTRAMUSCULAR | Status: DC | PRN
  Administered 2016-03-26 (×4): 25 ug via INTRAVENOUS

## 2016-03-26 MED ORDER — DEXAMETHASONE SODIUM PHOSPHATE 10 MG/ML IJ SOLN
INTRAMUSCULAR | Status: DC | PRN
  Administered 2016-03-26: 8 mg via INTRAVENOUS

## 2016-03-26 MED ORDER — MIDAZOLAM HCL 2 MG/2ML IJ SOLN
INTRAMUSCULAR | Status: DC | PRN
Start: 2016-03-26 — End: 2016-03-26
  Administered 2016-03-26: 2 mg via INTRAVENOUS

## 2016-03-26 MED ORDER — HYDROCODONE-ACETAMINOPHEN 5-325 MG PO TABS: 1.0000 | ORAL_TABLET | Freq: Four times a day (QID) | ORAL | 0 refills | Status: DC | PRN

## 2016-03-26 MED ORDER — CEFAZOLIN SODIUM-DEXTROSE 2-4 GM/100ML-% IV SOLN
INTRAVENOUS | Status: AC
  Administered 2016-03-26: 2 g via INTRAVENOUS
  Filled 2016-03-26: qty 100

## 2016-03-26 MED ORDER — FENTANYL CITRATE (PF) 100 MCG/2ML IJ SOLN
INTRAMUSCULAR | Status: DC | PRN
  Administered 2016-03-26: 50 ug via INTRAVENOUS
  Administered 2016-03-26: 100 ug via INTRAVENOUS

## 2016-03-26 MED ORDER — SUCCINYLCHOLINE CHLORIDE 20 MG/ML IJ SOLN
INTRAMUSCULAR | Status: DC | PRN
  Administered 2016-03-26: 120 mg via INTRAVENOUS

## 2016-03-26 MED ORDER — OXYBUTYNIN CHLORIDE 5 MG PO TABS: 5.0000 mg | ORAL_TABLET | Freq: Three times a day (TID) | ORAL | 0 refills | Status: DC | PRN

## 2016-03-26 MED ORDER — SUGAMMADEX SODIUM 200 MG/2ML IV SOLN
INTRAVENOUS | Status: DC | PRN
  Administered 2016-03-26: 192.4 mg via INTRAVENOUS

## 2016-03-26 MED ORDER — CEFAZOLIN SODIUM-DEXTROSE 2-4 GM/100ML-% IV SOLN
2.0000 g | INTRAVENOUS | Status: AC
  Administered 2016-03-26: 2 g via INTRAVENOUS

## 2016-03-26 SURGICAL SUPPLY — 35 items
ADAPTER IRRIG TUBE 2 SPIKE SOL (ADAPTER) ×6 IMPLANT
ADPR TBG 2 SPK PMP STRL ASCP (ADAPTER) ×2
BAG DRN LRG CPC RND TRDRP CNTR (MISCELLANEOUS)
BAG URO DRAIN 2000ML W/SPOUT (MISCELLANEOUS) ×3 IMPLANT
BAG URO DRAIN 4000ML (MISCELLANEOUS) IMPLANT
CATH FOL 2WAY LX 20X30 (CATHETERS) IMPLANT
CATH FOL 2WAY LX 22X30 (CATHETERS) ×2 IMPLANT
CATH FOL LEG HOLDER (MISCELLANEOUS) ×3 IMPLANT
CATH FOLEY 3WAY 30CC 22FR (CATHETERS) IMPLANT
CATH URETL 5X70 OPEN END (CATHETERS) ×3 IMPLANT
CONTAINER COLLECT MORCELLATR (MISCELLANEOUS) ×1 IMPLANT
DRAPE SHEET LG 3/4 BI-LAMINATE (DRAPES) ×3 IMPLANT
DRAPE UTILITY 15X26 TOWEL STRL (DRAPES) ×2 IMPLANT
FILTER OVERFLOW MORCELLATOR (FILTER) ×1 IMPLANT
GLOVE BIO SURGEON STRL SZ 6.5 (GLOVE) ×4 IMPLANT
GLOVE BIO SURGEONS STRL SZ 6.5 (GLOVE) ×2
GOWN STRL REUS W/ TWL LRG LVL3 (GOWN DISPOSABLE) ×2 IMPLANT
GOWN STRL REUS W/TWL LRG LVL3 (GOWN DISPOSABLE) ×6
KIT RM TURNOVER CYSTO AR (KITS) ×3 IMPLANT
LASER FIBER 550M SMARTSCOPE (Laser) ×3 IMPLANT
MORCELLATOR COLLECT CONTAINER (MISCELLANEOUS) ×3
MORCELLATOR OVERFLOW FILTER (FILTER) ×3
MORCELLATOR ROTATION 4.75 335 (MISCELLANEOUS) ×3 IMPLANT
PACK CYSTO AR (MISCELLANEOUS) ×3 IMPLANT
SENSORWIRE 0.038 NOT ANGLED (WIRE) ×3
SET CYSTO W/LG BORE CLAMP LF (SET/KITS/TRAYS/PACK) IMPLANT
SET IRRIG Y TYPE TUR BLADDER L (SET/KITS/TRAYS/PACK) ×3 IMPLANT
SLEEVE PROTECTION STRL DISP (MISCELLANEOUS) ×6 IMPLANT
SOL .9 NS 3000ML IRR  AL (IV SOLUTION) ×42
SOL .9 NS 3000ML IRR AL (IV SOLUTION) ×21
SOL .9 NS 3000ML IRR UROMATIC (IV SOLUTION) ×4 IMPLANT
SYRINGE IRR TOOMEY STRL 70CC (SYRINGE) ×3 IMPLANT
TUBE PUMP MORCELLATOR PIRANHA (TUBING) ×3 IMPLANT
WATER STERILE IRR 1000ML POUR (IV SOLUTION) ×3 IMPLANT
WIRE SENSOR 0.038 NOT ANGLED (WIRE) IMPLANT

## 2016-03-26 NOTE — ED Triage Notes (Signed)
Pt arrived via [facility] c/o witnessed syncope x3.  Pt was sitting each time, no injuries.  Pt had prostate surgery 3 hours ago at [facility].  Denies any pain, foley cath placed today at [place], draining bloody urine.

## 2016-03-26 NOTE — Discharge Instructions (Signed)
Read the information below.  You may return to the Emergency Department at any time for worsening condition or any new symptoms that concern you.  If you develop fevers, generalized weakness, shortness of breath, you pass out again, have a significant increase in bleeding, develop abdominal pain, or your catheter is not draining, return to the Emergency Department for a recheck.  Please follow your post-operative instructions from urology and call them to let them know what happened today.

## 2016-03-26 NOTE — ED Notes (Signed)
110/70 after standing for 2 minutes.

## 2016-03-26 NOTE — OR Nursing (Signed)
Patient presented to hospital with urinary cath in place.

## 2016-03-26 NOTE — Anesthesia Preprocedure Evaluation (Signed)
Anesthesia Evaluation  Patient identified by MRN, date of birth, ID band Patient awake    Reviewed: Allergy & Precautions, NPO status , Patient's Chart, lab work & pertinent test results  History of Anesthesia Complications Negative for: history of anesthetic complications  Airway Mallampati: I  TM Distance: >3 FB Neck ROM: Full    Dental no notable dental hx.    Pulmonary neg sleep apnea, neg COPD,    breath sounds clear to auscultation- rhonchi (-) wheezing      Cardiovascular Exercise Tolerance: Good hypertension, Pt. on medications (-) CAD and (-) Past MI  Rhythm:Regular Rate:Normal - Systolic murmurs and - Diastolic murmurs    Neuro/Psych negative neurological ROS  negative psych ROS   GI/Hepatic Neg liver ROS, GERD  ,  Endo/Other  negative endocrine ROSneg diabetes  Renal/GU Renal disease (recent ARF in setting of postrenal obstruction from BPH, renal function now normal)     Musculoskeletal   Abdominal (+) - obese,   Peds  Hematology  (+) anemia ,   Anesthesia Other Findings Past Medical History: No date: Anemia No date: Asthma     Comment: childhood asthma No date: Enlarged prostate with urinary retention No date: GERD (gastroesophageal reflux disease) No date: Hypertension   Reproductive/Obstetrics                             Anesthesia Physical Anesthesia Plan  ASA: II  Anesthesia Plan: General   Post-op Pain Management:    Induction: Intravenous  Airway Management Planned: Oral ETT  Additional Equipment:   Intra-op Plan:   Post-operative Plan: Extubation in OR  Informed Consent: I have reviewed the patients History and Physical, chart, labs and discussed the procedure including the risks, benefits and alternatives for the proposed anesthesia with the patient or authorized representative who has indicated his/her understanding and acceptance.   Dental advisory  given  Plan Discussed with: Anesthesiologist and CRNA  Anesthesia Plan Comments:        Anesthesia Quick Evaluation

## 2016-03-26 NOTE — Progress Notes (Signed)
Irrigated foley  4occ of ns in 20 out  With clot    60cc of ns in  None out

## 2016-03-26 NOTE — Anesthesia Procedure Notes (Signed)
Procedure Name: Intubation Date/Time: 03/26/2016 9:27 AM Performed by: Junious SilkNOLES, Glen Spencer Pre-anesthesia Checklist: Patient identified, Patient being monitored, Timeout performed, Emergency Drugs available and Suction available Patient Re-evaluated:Patient Re-evaluated prior to inductionOxygen Delivery Method: Circle system utilized Preoxygenation: Pre-oxygenation with 100% oxygen Intubation Type: IV induction Ventilation: Mask ventilation without difficulty Laryngoscope Size: Mac and 3 Grade View: Grade II Tube type: Oral Tube size: 7.5 mm Number of attempts: 1 Airway Equipment and Method: Stylet Placement Confirmation: ETT inserted through vocal cords under direct vision,  positive ETCO2 and breath sounds checked- equal and bilateral Secured at: 21 cm Tube secured with: Tape Dental Injury: Teeth and Oropharynx as per pre-operative assessment

## 2016-03-26 NOTE — Op Note (Signed)
Date of procedure: 03/26/16  Preoperative diagnosis:  1. BPH with bladder outlet obstruction 2. Urinary retention   Postoperative diagnosis:  1. Same as above   Procedure: 1. Holmium laser enucleation of the prostate  Surgeon: Vanna ScotlandAshley Aleah Ahlgrim, MD  Anesthesia: General  Complications: None  Intraoperative findings: Large 200 cc prostate with trilobar coaptation, large median lobe component.  EBL: 200 cc  Specimens: Prostate chips  Drains: 22 French two-way Foley catheter with 30 cc balloon  Indication: Glen Spencer is a 75 y.o. patient with urinary retention status post multiple failed voiding trials and enlarged partly 200 cc prostate with sequela of chronic outlet obstruction including heavily trabeculated bladder. After reviewing the management options for treatment, he elected to proceed with the above surgical procedure(s). We have discussed the potential benefits and risks of the procedure, side effects of the proposed treatment, the likelihood of the patient achieving the goals of the procedure, and any potential problems that might occur during the procedure or recuperation. Informed consent has been obtained.  Description of procedure:  The patient was taken to the operating room and general anesthesia was induced.  The patient was placed in the dorsal lithotomy position, prepped and draped in the usual sterile fashion, and preoperative antibiotics were administered. A preoperative time-out was performed.   A 26 French resectoscope was advanced per urethra into the bladder using blunt angled obturator. At this point in time a 30 lens was brought in along with the laser bridge. The bladder was carefully inspected and noted to be heavily trabeculated. The bladder neck was significantly elevated. There is a large median and lateral lobe component prostate. The prostatic length was partly 8 cm with trilobar coaptation. The trigone was a good distance from the bladder neck with  easily identifiable UOs. At this point in time, 550  laser fiber was brought in and using settings of 2 J and a proximal and 50 Hz, 2 incisions were created at the 5:00 and 7:00 positions on the bladder neck extending down just proximal to the vera montanum meeting in the midline. The incision was carried down to the prosthetic capsule. The median lobe was then enucleated in a caudal to cranial fashion rolling it anteriorly towards the bladder neck and ultimately able to cleave off this adenoma and push it into the bladder. Upon doing so, the bladder neck was noted to be level at this point and no longer elevated. Attention was then turned to the left lateral lobe. A curvilinear incision was created using the laser fiber at the prostatic apex again just proximal to the verumontanum with care taken to avoid any resection or incision distal to this. At this point time, the adenoma was separated from the underlying capsule. Again, the dissection was carried out and a caudal to cranial direction until the adenoma was able to be cleared from the bladder neck. Of note, on the side, it was in a bit of a piece wise fashion and a few BPH nodules were individually enucleated. Same exact procedure was then was performed on the right lateral lobe. Eventually, the adenoma was able to be cleared from the bladder neck. This point time, the prostatic fossa was widely patent. There is some oozing from the bladder neck but otherwise hemostasis was adequate.  A Wolf piranha morcellator was then brought in and using a nephroscope through the penis and the resected adenoma was carefully morcellated with care taken to avoid any injury to the bladder. A few large pieces were  grasped using graspers and brought out through the urethra en bloc. Eventually, I believe always able to clear most, if not all of the adenoma from the bladder although hemostasis although visualization was somewhat difficult. The bladder was able to be irrigated  easily. Finally, the scope was removed and a 22 [place]French two-way Foley catheter was then inserted using a catheter guide. The bladder again irrigated well. The balloon was filled with 30 cc of sterile water. The patient was administered 10 mg of IV Lasix to help facilitate diuresis. He was sent cleaned and dried, repositioned supine position, reversed from anesthesia, taken to the PACU in stable condition. There were no complications in this case.  Plan: Patient will follow-up in 1 week for nurse visit voiding trial. He'll return to our office for an M.D. visit in approximately 6 weeks. He should continue Flomax and finasteride in the interim.  [name], M.D.

## 2016-03-26 NOTE — ED Provider Notes (Signed)
[facility]-EMERGENCY DEPT Provider Note   CSN: [id number] Arrival date & time: 03/26/16  1740     History   Chief Complaint Chief Complaint  Patient presents with  . Loss of Consciousness    HPI Glen Spencer is a 75 y.o. male.  HPI   Pt with hx anemia, HTN, enlarged prostate p/w two episodes of syncope.  Pt had outpatient surgery today, HOLEP procedure (Holmium laser enucleation of the prostate).  Wife notes pt has not eaten since 12am today.  Pt reported feeling "woozy" when he left the hospital today.  Once he got home he sat down on the cough and lost consciousness x 3 minutes.  His wife called EMS.  When EMS was there pt had another episode of syncope.  Pt notes he started sweating when this occurred and his breathing was abnormal.  She notes no shaking.  Pt reports he is currently feeling fine.      Past Medical History:  Diagnosis Date  . Anemia   . Asthma    childhood asthma  . Enlarged prostate with urinary retention   . GERD (gastroesophageal reflux disease)   . Hypertension     Patient Active Problem List   Diagnosis Date Noted  . Benign prostatic hyperplasia with urinary retention 03/08/2016  . Acute renal failure (ARF) (HCC) 02/12/2016  . Urinary retention 02/12/2016  . Essential hypertension with goal blood pressure less than 130/85 02/12/2016  . Hypokalemia 02/12/2016  . Thrombocytopenia (HCC) 02/12/2016  . Flank pain 02/12/2016    Past Surgical History:  Procedure Laterality Date  . COLONOSCOPY WITH PROPOFOL N/A 08/08/2015   Procedure: COLONOSCOPY WITH PROPOFOL;  Surgeon: Wallace Cullens, MD;  Location: [facility] ENDOSCOPY;  Service: Gastroenterology;  Laterality: N/A;  . ESOPHAGOGASTRODUODENOSCOPY (EGD) WITH PROPOFOL N/A 08/08/2015   Procedure: ESOPHAGOGASTRODUODENOSCOPY (EGD) WITH PROPOFOL;  Surgeon: Wallace Cullens, MD;  Location: [facility] ENDOSCOPY;  Service: Gastroenterology;  Laterality: N/A;  . HERNIA REPAIR Right 1962   Inguinal Hernia       Home Medications     Prior to Admission medications   Medication Sig Start Date End Date Taking? Authorizing Provider  docusate sodium (COLACE) 100 MG capsule Take 1 capsule (100 mg total) by mouth 2 (two) times daily. 03/26/16   [name], MD  finasteride (PROSCAR) 5 MG tablet Take 1 tablet (5 mg total) by mouth daily. 03/05/16   [name], MD  HYDROcodone-acetaminophen (NORCO/VICODIN) 5-325 MG tablet Take 1-2 tablets by mouth every 6 (six) hours as needed for moderate pain. 03/26/16   [name], MD  oxybutynin (DITROPAN) 5 MG tablet Take 1 tablet (5 mg total) by mouth every 8 (eight) hours as needed for bladder spasms. 03/26/16   [name], MD  pantoprazole (PROTONIX) 40 MG tablet Take 40 mg by mouth 2 (two) times daily. Take 30 to 45 minutes prior to first meal of the day    Historical Provider, MD  potassium chloride (KLOR-CON) 20 MEQ packet Take 20 mEq by mouth 2 (two) times daily.    Historical Provider, MD  tamsulosin (FLOMAX) 0.4 MG CAPS capsule Take 0.4 mg by mouth daily. Take 30 minutes after same meal each day    Historical Provider, MD  valsartan-hydrochlorothiazide (DIOVAN-HCT) 160-25 MG tablet Take 1 tablet by mouth daily.    Historical Provider, MD    Family History Family History  Problem Relation Age of Onset  . Prostate cancer Neg Hx   . Bladder Cancer Neg Hx   . Kidney cancer Neg  Hx     Social History Social History  Substance Use Topics  . Smoking status: Never Smoker  . Smokeless tobacco: Never Used  . Alcohol use No     Allergies   Patient has no known allergies.   Review of Systems Review of Systems   Physical Exam Updated Vital Signs BP 123/66   Pulse 79   Temp 98 F (36.7 C) (Oral)   Resp 16   Ht 5' 10.5" (1.791 m)   Wt 95.7 kg   SpO2 100%   BMI 29.85 kg/m   Physical Exam  Constitutional: He appears well-developed and well-nourished. No distress.  HENT:  Head: Normocephalic and atraumatic.  Neck: Neck supple.  Cardiovascular: Normal  rate and regular rhythm.   Pulmonary/Chest: Effort normal and breath sounds normal. No respiratory distress. He has no wheezes. He has no rales.  Abdominal: Soft. He exhibits no distension and no mass. There is tenderness (mild ,diffuse). There is no rebound and no guarding.  Genitourinary:  Genitourinary Comments: Foley catheter in place with red liquid output  Neurological: He is alert. He exhibits normal muscle tone.  Skin: He is not diaphoretic.  Nursing note and vitals reviewed.    ED Treatments / Results  Labs (all labs ordered are listed, but only abnormal results are displayed) Labs Reviewed  BASIC METABOLIC PANEL - Abnormal; Notable for the following:       Result Value   Sodium 133 (*)    Glucose, Bld 145 (*)    Creatinine, Ser 1.36 (*)    Calcium 8.6 (*)    GFR calc non Af Amer 50 (*)    GFR calc Af Amer 58 (*)    All other components within normal limits  CBC WITH DIFFERENTIAL/PLATELET - Abnormal; Notable for the following:    RBC 3.64 (*)    Hemoglobin 10.8 (*)    HCT 32.3 (*)    Neutro Abs 8.7 (*)    All other components within normal limits    EKG  EKG Interpretation None       Radiology No results found.  Procedures Procedures (including critical care time)  Medications Ordered in ED Medications  sodium chloride 0.9 % bolus 1,000 mL (0 mLs Intravenous Stopped 03/26/16 1928)     Initial Impression / Assessment and Plan / ED Course  I have reviewed the triage vital signs and the nursing notes.  Pertinent labs & imaging results that were available during my care of the patient were reviewed by me and considered in my medical decision making (see chart for details).  Clinical Course     Afebrile, nontoxic patient with syncope x 2, once while on the monitor.  Rhythm strip during syncope reviewed by Dr Fayrene FearingJames.  Pt has been NPO since 10pm the night before and underwent laser surgery for prostate - his Hgb did drop from 14 to 10 today preop to now but  this may be expected.  Pt has red liquid output from foley but this appears to be bloody urine, not hemorrhage.  BP is normal.  He is feeling much better after IVF and PO meal.  Asymptomatic with orthostatic testing.  Discussed pt with Dr Fayrene FearingJames.  D/C home with return precautions, close follow up with urology, PCP.  Pt and wife made aware of drop in Hgb and will discuss with urology tomorrow.  Discussed result, findings, treatment, and follow up  with patient.  Pt given return precautions.  Pt verbalizes understanding and agrees with plan.  Final Clinical Impressions(s) / ED Diagnoses   Final diagnoses:  Syncope, unspecified syncope type    New Prescriptions New Prescriptions   No medications on file     Trixie Dredgemily Lovelace Cerveny, PA-C 03/26/16 1936    Rolland PorterMark James, MD 04/07/16 1816

## 2016-03-26 NOTE — Transfer of Care (Signed)
Immediate Anesthesia Transfer of Care Note  Patient: Glen GriffithsRaymond E Utke  Procedure(s) Performed: Procedure(s): HOLEP-LASER ENUCLEATION OF THE PROSTATE WITH MORCELLATION (N/A) HOLMIUM LASER APPLICATION (N/A)  Patient Location: PACU  Anesthesia Type:General  Level of Consciousness: sedated  Airway & Oxygen Therapy: Patient Spontanous Breathing and Patient connected to face mask oxygen  Post-op Assessment: Report given to RN and Post -op Vital signs reviewed and stable  Post vital signs: Reviewed and stable  Last Vitals:  Vitals:   03/26/16 0830  BP: (!) 147/78  Pulse: 62  Resp: 16  Temp: 36.6 C    Last Pain:  Vitals:   03/26/16 0830  TempSrc: Tympanic         Complications: No apparent anesthesia complications

## 2016-03-26 NOTE — Progress Notes (Signed)
Dr Apolinar Junesbrandon in to see pt irrigated foley   Very few clots

## 2016-03-26 NOTE — H&P (View-Only) (Signed)
03/09/2016 9:09 AM   Glen Spencer 03-29-1941 [id number]  Referring provider: Dorothey Basemanavid Bronstein, MD [phone] [place], [place] [address]  Chief Complaint  Patient presents with  . Follow-up    discuss surgery    HPI: 75 year old male who presents to the office for further evaluation of severe urinary retention with bilateral hydroureteronephrosis, acute renal failure requiring hospitalization s/p failed VT x 2.  He returns today accompanied by his wife to discuss further evaluation/surgical intervention for his chronic outlet obstruction.  He has a known history of urinary issues was treated for urinary tract infection in 09/2015.  He was started on Flomax his PCP.  Since then, he has had difficulty urinating and emptying his bladder. Shortly prior to his admission, he was having urinary leakage and incontinence.  Upon presenting to the emergency room, a Foley catheter was placed in greater than 2.5 L was drained.  Creatinine on admission was 2.87 and improved to 1.7 by the time of discharge. It appears that his baseline is around 1.1.  His Foley catheter has remained in place since discharge.He has failed VT x 2.  He is currently on Flomax and finasteride.  Most recent PSA 2.23 on 07/07/2015.  Rectal exam enlarged, otherwise unremarkable.    He denies a personal history of urinary retention. He is somewhat of a difficult historian turned to recall his baseline urinary symptoms prior to June. He thinks that these issues have been slowly worsening over time.  Cystoscopy showed a massively enlarged prostate, 8 sentinel meter prostatic length with severe trilobar coaptation, heavy bladder trabeculation with saccules, trigone difficult to identify due to distortion from median lobe.    PMH: Past Medical History:  Diagnosis Date  . Anemia   . Enlarged prostate with urinary retention   . Hypertension     Surgical History: Past Surgical History:  Procedure Laterality Date  .  COLONOSCOPY WITH PROPOFOL N/A 08/08/2015   Procedure: COLONOSCOPY WITH PROPOFOL;  Surgeon: Wallace CullensPaul Y Oh, MD;  [facility];  Service: Gastroenterology;  Laterality: N/A;  . ESOPHAGOGASTRODUODENOSCOPY (EGD) WITH PROPOFOL N/A 08/08/2015   Procedure: ESOPHAGOGASTRODUODENOSCOPY (EGD) WITH PROPOFOL;  Surgeon: Wallace CullensPaul Y Oh, MD;  [facility];  Service: Gastroenterology;  Laterality: N/A;    Home Medications:    Medication List       Accurate as of 03/09/16  9:09 AM. Always use your most recent med list.          finasteride 5 MG tablet Commonly known as:  PROSCAR Take 1 tablet (5 mg total) by mouth daily.   pantoprazole 40 MG tablet Commonly known as:  PROTONIX Take 40 mg by mouth 2 (two) times daily. Take 30 to 45 minutes prior to first meal of the day   tamsulosin 0.4 MG Caps capsule Commonly known as:  FLOMAX Take 0.4 mg by mouth daily. Take 30 minutes after same meal each day   valsartan-hydrochlorothiazide 160-25 MG tablet Commonly known as:  DIOVAN-HCT Take 1 tablet by mouth daily.       Allergies: No Known Allergies  Family History: Family History  Problem Relation Age of Onset  . Prostate cancer Neg Hx   . Bladder Cancer Neg Hx   . Kidney cancer Neg Hx     Social History:  reports that he has never smoked. He has never used smokeless tobacco. He reports that he does not drink alcohol or use drugs.  ROS: 12 point review systems was performed. Otherwise negative other than for  lower extremity swelling and urinary issues as above.  Physical Exam: BP 112/71 (BP Location: Left Arm, Patient Position: Sitting, Cuff Size: Normal)   Pulse 76   Ht 5\' 10"  (1.778 m)   Wt 213 lb 4.8 oz (96.8 kg)   BMI 30.61 kg/m   Constitutional:  Alert and oriented, No acute distress.  Accompanied by wife today. HEENT: [place] AT, moist mucus membranes.  Trachea midline, no masses. Cardiovascular: No clubbing, cyanosis, or edema.  RRR. Respiratory: Normal respiratory  effort, no increased work of breathing. CTAB. GI: Abdomen is soft, nontender, nondistended, no abdominal masses Skin: No rashes, bruises or suspicious lesions. Neurologic: Grossly intact, no focal deficits, moving all 4 extremities. Psychiatric: Normal mood and affect.  Laboratory Data: Lab Results  Component Value Date   WBC 8.7 02/23/2016   HGB 13.0 02/23/2016   HCT 38.1 (L) 02/23/2016   MCV 88.4 02/23/2016   PLT 140 (L) 02/23/2016    Lab Results  Component Value Date   CREATININE 0.94 02/23/2016    Lab Results  Component Value Date   PSA 3.52 02/12/2016    Pertinent Imaging: CT scan from 02/12/2016 reviews personally today. He does have a massively distended bladder with bilateral hydronephrosis. His prostate is significantly enlarged, calculated volume 197 cc.  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  Assessment & Plan:    1. Urinary retention Continue Flomax and finasteride Failed voiding trial 2  2. BPH with obstruction/lower urinary tract symptoms Massively enlarged prostate- 200 cc based on CT scan Suspect chronic incomplete bladder emptying based on the degree of bladder dilation, severe trabeculation He may have a component of neurogenic bladder due to chronic outlet obstruction- ideally would recommend urodynamics to further evaluate bladder function prior to outlet procedure, however, given that he is unable to generate a contraction, I feel that the utility of this may be limited  Options moving forward discussed in detail. These include chronic indwelling Foley catheter, clean intermittent catheterization, or an outlet procedure. Given the size of his gland, I feel that he would be a good candidate for either robotic simple prostatectomy, open simple prostatectomy, or holmium laser enucleation of the prostate. The procedure including risk and benefits of each were discussed in detail. His most interested holmium laser  enucleation of the prostate.  Risks including bleeding, infection, need for Foley catheter, failure of the procedure, increased urinary frequency/urgency, urge incontinence, stress incontinence, retrograde ejaculation were discussed. Injury to the bladder and prostate were also discussed in detail. All his questions were answered.  He understands that given the chronic nature of his outlet obstruction, it is a chance of this procedure may fail. He does wife understand these risks and would like to proceed.   3. Acute renal failure superimposed on chronic kidney disease, unspecified CKD stage, unspecified acute renal failure type (HCC) Secondary to outlet obstruction Plan to recheck labs with preop labs  Schedule holmium laser enucleation of the prostate  Vanna ScotlandAshley Margaret Cockerill, MD  [facility] [address], [address] [place], [place] [address] [phone]  I spent 25 min with this patient of which greater than 50% was spent in counseling and coordination of care with the patient.

## 2016-03-26 NOTE — Discharge Instructions (Addendum)
AMBULATORY SURGERY  DISCHARGE INSTRUCTIONS   1) The drugs that you were given will stay in your system until tomorrow so for the next 24 hours you should not:  A) Drive an automobile B) Make any legal decisions C) Drink any alcoholic beverage   2) You may resume regular meals tomorrow.  Today it is better to start with liquids and gradually work up to solid foods.  You may eat anything you prefer, but it is better to start with liquids, then soup and crackers, and gradually work up to solid foods.   3) Please notify your doctor immediately if you have any unusual bleeding, trouble breathing, redness and pain at the surgery site, drainage, fever, or pain not relieved by medication.    4) Additional Instructions:        Please contact your physician with any problems or Same Day Surgery at [phone], Monday through Friday 6 am to 4 pm, or [place] at [facility] number at [phone]  .Foley Catheter Care, Adult A Foley catheter is a soft, flexible tube that is placed into the bladder to drain urine. A Foley catheter may be inserted if:  You leak urine or are not able to control when you urinate (urinary incontinence).  You are not able to urinate when you need to (urinary retention).  You had prostate surgery or surgery on the genitals.  You have certain medical conditions, such as multiple sclerosis, dementia, or a spinal cord injury. If you are going home with a Foley catheter in place, follow the instructions below. TAKING CARE OF THE CATHETER 1. Wash your hands with soap and water. 2. Using mild soap and warm water on a clean washcloth:  Clean the area on your body closest to the catheter insertion site using a circular motion, moving away from the catheter. Never wipe toward the catheter because this could sweep bacteria up into the urethra and cause infection.  Remove all traces of soap. Pat the area dry with a clean towel. For males, reposition the  foreskin. 3. Attach the catheter to your leg so there is no tension on the catheter. Use adhesive tape or a leg strap. If you are using adhesive tape, remove any sticky residue left behind by the previous tape you used. 4. Keep the drainage bag below the level of the bladder, but keep it off the floor. 5. Check throughout the day to be sure the catheter is working and urine is draining freely. Make sure the tubing does not become kinked. 6. Do not pull on the catheter or try to remove it. Pulling could damage internal tissues. TAKING CARE OF THE DRAINAGE BAGS You will be given two drainage bags to take home. One is a large overnight drainage bag, and the other is a smaller leg bag that fits underneath clothing. You may wear the overnight bag at any time, but you should never wear the smaller leg bag at night. Follow the instructions below for how to empty, change, and clean your drainage bags. Emptying the Drainage Bag  You must empty your drainage bag when it is ?- full or at least 2-3 times a day. 1. Wash your hands with soap and water. 2. Keep the drainage bag below your hips, below the level of your bladder. This stops urine from going back into the tubing and into your bladder. 3. Hold the dirty bag over the toilet or a clean container. 4. Open the pour spout at the bottom of the  bag and empty the urine into the toilet or container. Do not let the pour spout touch the toilet, container, or any other surface. Doing so can place bacteria on the bag, which can cause an infection. 5. Clean the pour spout with a gauze pad or cotton ball that has rubbing alcohol on it. 6. Close the pour spout. 7. Attach the bag to your leg with adhesive tape or a leg strap. 8. Wash your hands well. Changing the Drainage Bag  Change your drainage bag once a month or sooner if it starts to smell bad or look dirty. Below are steps to follow when changing the drainage bag. 1. Wash your hands with soap and  water. 2. Pinch off the rubber catheter so that urine does not spill out. 3. Disconnect the catheter tube from the drainage tube at the connection valve. Do not let the tubes touch any surface. 4. Clean the end of the catheter tube with an alcohol wipe. Use a different alcohol wipe to clean the end of the drainage tube. 5. Connect the catheter tube to the drainage tube of the clean drainage bag. 6. Attach the new bag to the leg with adhesive tape or a leg strap. Avoid attaching the new bag too tightly. 7. Wash your hands well. Cleaning the Drainage Bag  1. Wash your hands with soap and water. 2. Wash the bag in warm, soapy water. 3. Rinse the bag thoroughly with warm water. 4. Fill the bag with a solution of white vinegar and water (1 cup vinegar to 1 qt warm water [.2 L vinegar to 1 L warm water]). Close the bag and soak it for 30 minutes in the solution. 5. Rinse the bag with warm water. 6. Hang the bag to dry with the pour spout open and hanging downward. 7. Store the clean bag (once it is dry) in a clean plastic bag. 8. Wash your hands well. PREVENTING INFECTION  Wash your hands before and after handling your catheter.  Take showers daily and wash the area where the catheter enters your body. Do not take baths. Replace wet leg straps with dry ones, if this applies.  Do not use powders, sprays, or lotions on the genital area. Only use creams, lotions, or ointments as directed by your caregiver.  For females, wipe from front to back after each bowel movement.  Drink enough fluids to keep your urine clear or pale yellow unless you have a fluid restriction.  Do not let the drainage bag or tubing touch or lie on the floor.  Wear cotton underwear to absorb moisture and to keep your skin drier. SEEK MEDICAL CARE IF:   Your urine is cloudy or smells unusually bad.  Your catheter becomes clogged.  You are not draining urine into the bag or your bladder feels full.  Your catheter  starts to leak. SEEK IMMEDIATE MEDICAL CARE IF:   You have pain, swelling, redness, or pus where the catheter enters the body.  You have pain in the abdomen, legs, lower back, or bladder.  You have a fever.  You see blood fill the catheter, or your urine is pink or red.  You have nausea, vomiting, or chills.  Your catheter gets pulled out. MAKE SURE YOU:   Understand these instructions.  Will watch your condition.  Will get help right away if you are not doing well or get worse. This information is not intended to replace advice given to you by your health care provider. Make  sure you discuss any questions you have with your health care provider. Document Released: 04/16/2005 Document Revised: 08/31/2013 Document Reviewed: 04/02/2015 Elsevier Interactive Patient Education  2017 [employer]      Foley Catheter Care, Adult A Foley catheter is a soft, flexible tube. This tube is placed into your bladder to drain pee (urine). If you go home with this catheter in place, follow the instructions below. TAKING CARE OF THE CATHETER 1. Wash your hands with soap and water. 2. Put soap and water on a clean washcloth.  Clean the skin where the tube goes into your body.  Clean away from the tube site.  Never wipe toward the tube.  Clean the area using a circular motion.  Remove all the soap. Pat the area dry with a clean towel. For males, reposition the skin that covers the end of the penis (foreskin). 3. Attach the tube to your leg with tape or a leg strap. Do not stretch the tube tight. If you are using tape, remove any stickiness left behind by past tape you used. 4. Keep the drainage bag below your hips. Keep it off the floor. 5. Check your tube during the day. Make sure it is working and draining. Make sure the tube does not curl, twist, or bend. 6. Do not pull on the tube or try to take it out. TAKING CARE OF THE DRAINAGE BAGS You will have a large overnight drainage bag  and a small leg bag. You may wear the overnight bag any time. Never wear the small bag at night. Follow the directions below. Emptying the Drainage Bag  Empty your drainage bag when it is ?- full or at least 2-3 times a day. 1. Wash your hands with soap and water. 2. Keep the drainage bag below your hips. 3. Hold the dirty bag over the toilet or clean container. 4. Open the pour spout at the bottom of the bag. Empty the pee into the toilet or container. Do not let the pour spout touch anything. 5. Clean the pour spout with a gauze pad or cotton ball that has rubbing alcohol on it. 6. Close the pour spout. 7. Attach the bag to your leg with tape or a leg strap. 8. Wash your hands well. Changing the Drainage Bag  Change your bag once a month or sooner if it starts to smell or look dirty.  9. Wash your hands with soap and water. 10. Pinch the rubber tube so that pee does not spill out. 11. Disconnect the catheter tube from the drainage tube at the connection valve. Do not let the tubes touch anything. 12. Clean the end of the catheter tube with an alcohol wipe. Clean the end of a the drainage tube with a different alcohol wipe. 13. Connect the catheter tube to the drainage tube of the clean drainage bag. 14. Attach the new bag to the leg with tape or a leg strap. Avoid attaching the new bag too tightly. 15. Wash your hands well. Cleaning the Drainage Bag  8. Wash your hands with soap and water. 9. Wash the bag in warm, soapy water. 10. Rinse the bag with warm water. 11. Fill the bag with a mixture of white vinegar and water (1 cup vinegar to 1 quart warm water [.2 liter vinegar to 1 liter warm water]). Close the bag and soak it for 30 minutes in the solution. 12. Rinse the bag with warm water. 13. Hang the bag to dry with the pour  spout open and hanging downward. 14. Store the clean bag (once it is dry) in a clean plastic bag. 15. Wash your hands well. PREVENT INFECTION  Wash your hands  before and after touching your tube.  Take showers every day. Wash the skin where the tube enters your body. Do not take baths. Replace wet leg straps with dry ones, if this applies.  Do not use powders, sprays, or lotions on the genital area. Only use creams, lotions, or ointments as told by your doctor.  For females, wipe from front to back after going to the bathroom.  Drink enough fluids to keep your pee clear or pale yellow unless you are told not to have too much fluid (fluid restriction).  Do not let the drainage bag or tubing touch or lie on the floor.  Wear cotton underwear to keep the area dry. GET HELP IF:  Your pee is cloudy or smells unusually bad.  Your tube becomes clogged.  You are not draining pee into the bag or your bladder feels full.  Your tube starts to leak. GET HELP RIGHT AWAY IF:  You have pain, puffiness (swelling), redness, or yellowish-white fluid (pus) where the tube enters the body.  You have pain in the belly (abdomen), legs, lower back, or bladder.  You have a fever.  You see blood fill the tube, or your pee is pink or red.  You feel sick to your stomach (nauseous), throw up (vomit), or have chills.  Your tube gets pulled out. MAKE SURE YOU:   Understand these instructions.  Will watch your condition.  Will get help right away if you are not doing well or get worse. This information is not intended to replace advice given to you by your health care provider. Make sure you discuss any questions you have with your health care provider. Document Released: 08/11/2012 Document Revised: 05/07/2014 Document Reviewed: 04/02/2015 Elsevier Interactive Patient Education  2017 [employer]

## 2016-03-26 NOTE — ED Notes (Signed)
Pt stable, ambulatory, states understanding of discharge instructions 

## 2016-03-26 NOTE — OR Nursing (Signed)
Foley flushed 60 cc ns. Clot noted after flush draining well

## 2016-03-26 NOTE — ED Provider Notes (Signed)
Patient seen and examined. Discussed with Glen DredgeEmily West PA-C. Patient with dizziness and near-syncope at home after being nothing by mouth from os 24 hours and urological procedure today. Catheter draining bloody urine but flowing well. Hemoglobin 10.8. Creatinine 1.36. Has been given fluids here. He is hungry. He feels well. He is tilt-negative with orthostatics. I think is appropriate for discharge home. They were made aware of the drop in his hemoglobin. He becomes otherwise symptomatic at home and asked them to return, or follow-up with primary care.   Rolland PorterMark Marshall Kampf, MD 03/26/16 [phone]

## 2016-03-26 NOTE — Interval H&P Note (Signed)
History and Physical Interval Note:  03/26/2016 8:54 AM  Glen Spencer  has presented today for surgery, with the diagnosis of BPH WITH LUTS, URINARY RETENTION  The various methods of treatment have been discussed with the patient and family. After consideration of risks, benefits and other options for treatment, the patient has consented to  Procedure(s): HOLEP-LASER ENUCLEATION OF THE PROSTATE WITH MORCELLATION (N/A) HOLMIUM LASER APPLICATION (N/A) as a surgical intervention .  The patient's history has been reviewed, patient examined, no change in status, stable for surgery.  I have reviewed the patient's chart and labs.  Questions were answered to the patient's satisfaction.    Preop urine culture with mixed flora consistent with indwelling Foley.    [name]

## 2016-03-27 ENCOUNTER — Encounter: Payer: Self-pay | Admitting: Urology

## 2016-03-27 LAB — SURGICAL PATHOLOGY

## 2016-03-27 NOTE — Anesthesia Postprocedure Evaluation (Signed)
Anesthesia Post Note  Patient: Glen GriffithsRaymond E Spencer  Procedure(s) Performed: Procedure(s) (LRB): HOLEP-LASER ENUCLEATION OF THE PROSTATE WITH MORCELLATION (N/A) HOLMIUM LASER APPLICATION (N/A)  Patient location during evaluation: PACU Anesthesia Type: General Level of consciousness: awake and alert Pain management: pain level controlled Vital Signs Assessment: post-procedure vital signs reviewed and stable Respiratory status: spontaneous breathing, nonlabored ventilation, respiratory function stable and patient connected to nasal cannula oxygen Cardiovascular status: blood pressure returned to baseline and stable Postop Assessment: no signs of nausea or vomiting Anesthetic complications: no    Last Vitals:  Vitals:   03/26/16 1415 03/26/16 1509  BP: 138/66 124/78  Pulse: 71 74  Resp: 16 16  Temp: (!) 36.1 C     Last Pain:  Vitals:   03/26/16 1459  TempSrc:   PainSc: 5                  Cleda MccreedyJoseph K Monik Lins

## 2016-04-02 ENCOUNTER — Ambulatory Visit (INDEPENDENT_AMBULATORY_CARE_PROVIDER_SITE_OTHER): Payer: Medicare Other | Admitting: *Deleted

## 2016-04-02 VITALS — BP 138/73 | HR 77 | Ht 70.0 in | Wt 208.5 lb

## 2016-04-02 DIAGNOSIS — R339 Retention of urine, unspecified: Secondary | ICD-10-CM

## 2016-04-02 LAB — BLADDER SCAN AMB NON-IMAGING: SCAN RESULT: 358

## 2016-04-02 NOTE — Progress Notes (Signed)
Fill and Pull Catheter Removal  Patient is present today for a catheter removal.  Patient was cleaned and prepped in a sterile fashion 210ml of sterile water/ saline was instilled into the bladder when the patient felt the urge to urinate. 30ml of water was then drained from the balloon.  A 22FR foley cath was removed from the bladder no complications were noted .  Patient as then given some time to void on their own.  Patient cannot void.  Patient tolerated well.  Preformed by: Rupert Stackshelsea Watkins, LPN   Follow up/ Additional notes: Pt was not able to void. Per Carollee HerterShannon pt will go home, push fluids, and RTC this afternoon at 3pm for a PVR. Pt voiced understanding.   Blood pressure 138/73, pulse 77, height 5\' 10"  (1.778 m), weight 208 lb 8 oz (94.6 kg).

## 2016-04-02 NOTE — Progress Notes (Signed)
Patient came back around three states he has urinated  Times through the day. But wants to know if he should continue the medications. Per Carollee HerterShannon continue until after recheck with Dr. Apolinar JunesBrandon in January. PVR also performed on patient 400 [facility] or get catheter replaced. Patient very disappointed and asks if he can use the bathroom again and retest because he has drank about 9 bottles of water. Carollee HerterShannon stated he could try PVR after second bathroom try is 358. Patient will need a catheter again.   Simple Catheter Placement  Due to urinary retention patient is present today for a foley cath placement.  Patient was cleaned and prepped in a sterile fashion with betadine and lidocaine jelly 2% was instilled into the urethra.  A 16 FR foley catheter was inserted, urine return was noted  390ml, urine was clear in color.  The balloon was filled with 10cc of sterile water.  A leg bag was attached for drainage. Patient was also given a night bag to take home and was given instruction on how to change from one bag to another.  Patient was given instruction on proper catheter care.  Patient tolerated well, no complications were noted .  Preformed by: Dallas Schimkeamona Sincerity Cedar CMA  Additional notes/ Follow up: 7 days for voiding trial on nurse schedule

## 2016-04-09 ENCOUNTER — Ambulatory Visit (INDEPENDENT_AMBULATORY_CARE_PROVIDER_SITE_OTHER): Payer: Medicare Other

## 2016-04-09 VITALS — BP 131/82 | HR 90 | Ht 70.0 in | Wt 205.9 lb

## 2016-04-09 DIAGNOSIS — R339 Retention of urine, unspecified: Secondary | ICD-10-CM

## 2016-04-09 NOTE — Progress Notes (Signed)
Fill and Pull Catheter Removal  Patient is present today for a catheter removal.  Patient was cleaned and prepped in a sterile fashion 170ml of sterile water/ saline was instilled into the bladder when the patient felt the urge to urinate. 10ml of water was then drained from the balloon.  A 16FR foley cath was removed from the bladder no complications were noted .  Patient as then given some time to void on their own.  Patient can void  80ml on their own after some time.  Patient tolerated well.  Preformed by: Rupert Stackshelsea Tayvin Preslar, LPN   Follow up/ Additional notes: Pt was able to void half of the measured amount put in. Requested pt RTC today at 3 for a bladder scan. Pt stated that he is not able to RTC today due to prior commitments. Therefore pt will RTC tomorrow morning for bladder scan unless develops pain today. Pt voiced understanding.   Blood pressure 131/82, pulse 90, height 5\' 10"  (1.778 m), weight 205 lb 14.4 oz (93.4 kg).

## 2016-04-10 ENCOUNTER — Ambulatory Visit (INDEPENDENT_AMBULATORY_CARE_PROVIDER_SITE_OTHER): Payer: Medicare Other

## 2016-04-10 DIAGNOSIS — R339 Retention of urine, unspecified: Secondary | ICD-10-CM

## 2016-04-10 LAB — BLADDER SCAN AMB NON-IMAGING: Scan Result: 157

## 2016-04-10 NOTE — Progress Notes (Signed)
Bladder Scan:157 Patient can void:  Performed By: Rupert Stackshelsea Watkins, LPN

## 2016-05-09 ENCOUNTER — Encounter: Payer: Self-pay | Admitting: Urology

## 2016-05-09 ENCOUNTER — Ambulatory Visit (INDEPENDENT_AMBULATORY_CARE_PROVIDER_SITE_OTHER): Payer: Medicare Other | Admitting: Urology

## 2016-05-09 VITALS — BP 149/89 | HR 80 | Ht 70.0 in | Wt 212.0 lb

## 2016-05-09 DIAGNOSIS — R339 Retention of urine, unspecified: Secondary | ICD-10-CM

## 2016-05-09 DIAGNOSIS — N401 Enlarged prostate with lower urinary tract symptoms: Secondary | ICD-10-CM

## 2016-05-09 DIAGNOSIS — R338 Other retention of urine: Secondary | ICD-10-CM

## 2016-05-09 DIAGNOSIS — N3000 Acute cystitis without hematuria: Secondary | ICD-10-CM

## 2016-05-09 LAB — URINALYSIS, COMPLETE
BILIRUBIN UA: NEGATIVE
Glucose, UA: NEGATIVE
KETONES UA: NEGATIVE
Nitrite, UA: POSITIVE — AB
PH UA: 6 (ref 5.0–7.5)
SPEC GRAV UA: 1.015 (ref 1.005–1.030)
UUROB: 0.2 mg/dL (ref 0.2–1.0)

## 2016-05-09 LAB — MICROSCOPIC EXAMINATION
EPITHELIAL CELLS (NON RENAL): NONE SEEN /HPF (ref 0–10)
RBC MICROSCOPIC, UA: NONE SEEN /HPF (ref 0–?)
WBC, UA: >30 /hpf — AB (ref 0–?)

## 2016-05-09 LAB — BLADDER SCAN AMB NON-IMAGING

## 2016-05-09 NOTE — Progress Notes (Signed)
05/09/2016 4:09 PM   Glen Spencer 1940/08/29 [id number]  Referring provider: [name], MD [name] [place], [place] [address]  Chief Complaint  Patient presents with  . Routine Post Op    6wk recheck    HPI: 76 year old male who returns to the office s/p HoLEP on 03/26/16 for massively enlarged prostate.  Surgical pathology c/w benign prostatic tissue with stromal hyperplasia. A total of 76 g was resected.  He initially presented with severe urinary retention with bilateral hydroureteronephrosis, acute renal failure requiring hospitalization s/p failed VT x 2.    His Foley catheter has remained in place since discharge.He has failed VT x 2.    He is currently on Flomax and finasteride.  Most recent PSA 2.23 on 07/07/2015.  Rectal exam enlarged, otherwise unremarkable.    Cystoscopy showed a massively enlarged prostate, 8 sentinel meter prostatic length with severe trilobar coaptation, heavy bladder trabeculation with saccules, trigone difficult to identify due to distortion from median lobe.   Calculated prostate volume 178 cc from CT can.  Since surgery, he's been doing remarkably well. He is now voiding independently without need for catheterization. He is extremely pleased with his results. He states that he has an excellent urinary stream, no significant frequency or urgency and nocturia 1 which is down significantly from his previous baseline. He does have some mild dysuria but otherwise no fevers or chills. No gross hematuria.  He did have one emergency room visit following surgery for dizziness/acute blood loss anemia. He had no issues since then.  PMH: Past Medical History:  Diagnosis Date  . Anemia   . Asthma    childhood asthma  . Enlarged prostate with urinary retention   . GERD (gastroesophageal reflux disease)   . Hypertension     Surgical History: Past Surgical History:  Procedure Laterality Date  . COLONOSCOPY WITH PROPOFOL N/A 08/08/2015     Procedure: COLONOSCOPY WITH PROPOFOL;  Surgeon: Wallace Cullens, MD;  Location: [facility] ENDOSCOPY;  Service: Gastroenterology;  Laterality: N/A;  . ESOPHAGOGASTRODUODENOSCOPY (EGD) WITH PROPOFOL N/A 08/08/2015   Procedure: ESOPHAGOGASTRODUODENOSCOPY (EGD) WITH PROPOFOL;  Surgeon: Wallace Cullens, MD;  Location: [facility] ENDOSCOPY;  Service: Gastroenterology;  Laterality: N/A;  . HERNIA REPAIR Right 1962   Inguinal Hernia  . HOLEP-LASER ENUCLEATION OF THE PROSTATE WITH MORCELLATION N/A 03/26/2016   Procedure: HOLEP-LASER ENUCLEATION OF THE PROSTATE WITH MORCELLATION;  Surgeon: [name], MD;  Location: [facility] ORS;  Service: Urology;  Laterality: N/A;  . HOLMIUM LASER APPLICATION N/A 03/26/2016   Procedure: HOLMIUM LASER APPLICATION;  Surgeon: [name], MD;  Location: [facility] ORS;  Service: Urology;  Laterality: N/A;    Home Medications:  Allergies as of 05/09/2016   No Known Allergies     Medication List       Accurate as of 05/09/16  4:09 PM. Always use your most recent med list.          docusate sodium 100 MG capsule Commonly known as:  COLACE Take 1 capsule (100 mg total) by mouth 2 (two) times daily.   finasteride 5 MG tablet Commonly known as:  PROSCAR Take 1 tablet (5 mg total) by mouth daily.   pantoprazole 40 MG tablet Commonly known as:  PROTONIX Take 40 mg by mouth 2 (two) times daily. Take 30 to 45 minutes prior to first meal of the day   potassium chloride 20 MEQ packet Commonly known as:  KLOR-CON Take 20 mEq by mouth 2 (two) times daily.  valsartan-hydrochlorothiazide 160-25 MG tablet Commonly known as:  DIOVAN-HCT Take 1 tablet by mouth daily.       Allergies: No Known Allergies  Family History: Family History  Problem Relation Age of Onset  . Prostate cancer Neg Hx   . Bladder Cancer Neg Hx   . Kidney cancer Neg Hx     Social History:  reports that he has never smoked. He has never used smokeless tobacco. He reports that he does not drink alcohol or use  drugs.  ROS: UROLOGY Frequent Urination?: No Hard to postpone urination?: No Burning/pain with urination?: Yes Get up at night to urinate?: No Leakage of urine?: No Urine stream starts and stops?: No Trouble starting stream?: No Do you have to strain to urinate?: No Blood in urine?: No Urinary tract infection?: No Sexually transmitted disease?: No Injury to kidneys or bladder?: No Painful intercourse?: No Weak stream?: No Erection problems?: No Penile pain?: No Gastrointestinal Nausea?: No Vomiting?: No Indigestion/heartburn?: No Diarrhea?: No Constipation?: No Constitutional Fever: No Night sweats?: No Weight loss?: No Fatigue?: No Skin Skin rash/lesions?: No Itching?: No Eyes Blurred vision?: No Double vision?: No Ears/Nose/Throat Sore throat?: No Sinus problems?: No Hematologic/Lymphatic Swollen glands?: No Easy bruising?: No Cardiovascular Leg swelling?: No Chest pain?: No Respiratory Cough?: No Shortness of breath?: No Endocrine Excessive thirst?: No Musculoskeletal Back pain?: No Joint pain?: No Neurological Headaches?: No Dizziness?: No Psychologic Depression?: No Anxiety?: No   Physical Exam: BP (!) 149/89   Pulse 80   Ht 5\' 10"  (1.778 m)   Wt 212 lb (96.2 kg)   BMI 30.42 kg/m   Constitutional:  Alert and oriented, No acute distress.  Accompanied by wife today. HEENT: [place] AT, moist mucus membranes.  Trachea midline, no masses. Cardiovascular: No clubbing, cyanosis, or edema.   Respiratory: Normal respiratory effort, no increased work of breathing.  GI: Abdomen is soft, nontender, nondistended, no abdominal masses Skin: No rashes, bruises or suspicious lesions. Neurologic: Grossly intact, no focal deficits, moving all 4 extremities. Psychiatric: Normal mood and affect.  Laboratory Data: Lab Results  Component Value Date   WBC 10.1 03/26/2016   HGB 10.8 (L) 03/26/2016   HCT 32.3 (L) 03/26/2016   MCV 88.7 03/26/2016   PLT 218  03/26/2016    Lab Results  Component Value Date   CREATININE 1.36 (H) 03/26/2016    Lab Results  Component Value Date   PSA 3.52 02/12/2016    Pertinent Imaging: Results for orders placed or performed in visit on 05/09/16  CULTURE, URINE COMPREHENSIVE  Result Value Ref Range   Urine Culture, Comprehensive Preliminary report (A)    Result 1 Gram negative rods (A)   Microscopic Examination  Result Value Ref Range   WBC, UA >30 (A) 0 - 5 /hpf   RBC, UA None seen 0 - 2 /hpf   Epithelial Cells (non renal) None seen 0 - 10 /hpf   Bacteria, UA Few None seen/Few  Urinalysis, Complete  Result Value Ref Range   Specific Gravity, UA 1.015 1.005 - 1.030   pH, UA 6.0 5.0 - 7.5   Color, UA Yellow Yellow   Appearance Ur Cloudy (A) Clear   Leukocytes, UA 3+ (A) Negative   Protein, UA 1+ (A) Negative/Trace   Glucose, UA Negative Negative   Ketones, UA Negative Negative   RBC, UA 2+ (A) Negative   Bilirubin, UA Negative Negative   Urobilinogen, Ur 0.2 0.2 - 1.0 mg/dL   Nitrite, UA Positive (A) Negative  Microscopic Examination See below:   BLADDER SCAN AMB NON-IMAGING  Result Value Ref Range   Scan Result     Assessment & Plan:    1. Urinary retention/ incomplete bladder emptying S/p HoLEP 11/17 now voiding independently PVR mildly elevated today but significantly improved, suspect chronic bladder changes 2/2 longstanding outlet obstruction Continue finasteride, may ultimately wean in future Will stop Flomax as trial  2. BPH with obstruction/lower urinary tract symptoms Massively enlarged prostate- 200 cc based on CT scan S/p HoLEP  3. Acute cystitis UA today suspicious for UTI Will check UCx and treat as needed -UCx  Return in about 6 months (around 11/06/2016) for PVR,  IPSS, Uroflow.  [name], MD  [facility] [address] [place], [place] [address] [phone]

## 2016-05-14 ENCOUNTER — Telehealth: Payer: Self-pay

## 2016-05-14 DIAGNOSIS — N390 Urinary tract infection, site not specified: Secondary | ICD-10-CM

## 2016-05-14 LAB — CULTURE, URINE COMPREHENSIVE

## 2016-05-14 MED ORDER — CEPHALEXIN 500 MG PO CAPS: 500.0000 mg | ORAL_CAPSULE | Freq: Three times a day (TID) | ORAL | 0 refills | Status: DC

## 2016-05-14 NOTE — Telephone Encounter (Signed)
-----   Message from Vanna ScotlandAshley Brandon, MD sent at 05/14/2016 11:25 AM EST ----- Urine culture sent from clinic was positive for Klebsiella. He is having some mild dysuria therefore elected to treat. Please treat with Keflex 100 mg 3 times a day 7 days.  Vanna ScotlandAshley Brandon, MD

## 2016-05-14 NOTE — Telephone Encounter (Signed)
Patient called stating he saw his results on mychart script was sent to the pharm Keflex 500mg  TID per Dr. Apolinar JunesBrandon

## 2016-06-05 ENCOUNTER — Ambulatory Visit (INDEPENDENT_AMBULATORY_CARE_PROVIDER_SITE_OTHER): Payer: Medicare Other

## 2016-06-05 VITALS — BP 133/78 | HR 69 | Ht 70.0 in | Wt 213.5 lb

## 2016-06-05 DIAGNOSIS — R300 Dysuria: Secondary | ICD-10-CM

## 2016-06-05 LAB — MICROSCOPIC EXAMINATION
RBC, UA: NONE SEEN /hpf (ref 0–?)
WBC, UA: >30 /hpf — AB (ref 0–?)

## 2016-06-05 LAB — URINALYSIS, COMPLETE
Bilirubin, UA: NEGATIVE
Glucose, UA: NEGATIVE
KETONES UA: NEGATIVE
Nitrite, UA: POSITIVE — AB
SPEC GRAV UA: 1.015 (ref 1.005–1.030)
Urobilinogen, Ur: 0.2 mg/dL (ref 0.2–1.0)
pH, UA: 6 (ref 5.0–7.5)

## 2016-06-05 MED ORDER — SULFAMETHOXAZOLE-TRIMETHOPRIM 800-160 MG PO TABS: 1.0000 | ORAL_TABLET | Freq: Two times a day (BID) | ORAL | 0 refills | Status: AC

## 2016-06-05 NOTE — Addendum Note (Signed)
Addended by: Rupert StacksWATKINS, Abrey Bradway C on: 06/05/2016 11:53 AM   Modules accepted: Orders

## 2016-06-05 NOTE — Progress Notes (Addendum)
Pt presents today with c/o dysuria. Pt stated he has been on abx in the last 30 days for a UTI. Abx were given my Dr. Apolinar JunesBrandon. A clean catch was obtained for u/a and cx.  Blood pressure 133/78, pulse 69, height 5\' 10"  (1.778 m), weight 213 lb 8 oz (96.8 kg).  Per Dr. Apolinar JunesBrandon bactrim ds bid x10 days was sent to pharmacy.

## 2016-06-08 LAB — CULTURE, URINE COMPREHENSIVE

## 2016-07-24 ENCOUNTER — Ambulatory Visit (INDEPENDENT_AMBULATORY_CARE_PROVIDER_SITE_OTHER): Payer: Medicare Other

## 2016-07-24 VITALS — BP 145/84 | HR 60 | Ht 70.0 in | Wt 218.9 lb

## 2016-07-24 DIAGNOSIS — N390 Urinary tract infection, site not specified: Secondary | ICD-10-CM

## 2016-07-24 LAB — URINALYSIS, COMPLETE
Bilirubin, UA: NEGATIVE
Glucose, UA: NEGATIVE
Ketones, UA: NEGATIVE
Nitrite, UA: NEGATIVE
PH UA: 7 (ref 5.0–7.5)
PROTEIN UA: NEGATIVE
Specific Gravity, UA: 1.015 (ref 1.005–1.030)
UUROB: 0.2 mg/dL (ref 0.2–1.0)

## 2016-07-24 LAB — MICROSCOPIC EXAMINATION: RBC, UA: NONE SEEN /hpf (ref 0–?)

## 2016-07-24 NOTE — Progress Notes (Signed)
Pt presents today with c/o dysuria. Pt stated that he had a f/u with PCP-Dr. Terance HartBronstein and urine was checked. Dr. Terance HartBronstein placed pt on levofloxacin on 07/09/16. Pt stated since completing abx he continues to have dysuria. A clean catch was obtained for u/a and cx.   Blood pressure (!) 145/84, pulse 60, height 5\' 10"  (1.778 m), weight 218 lb 14.4 oz (99.3 kg).

## 2016-07-25 ENCOUNTER — Telehealth: Payer: Self-pay

## 2016-07-25 DIAGNOSIS — N390 Urinary tract infection, site not specified: Secondary | ICD-10-CM

## 2016-07-25 MED ORDER — CEPHALEXIN 500 MG PO CAPS: 500.0000 mg | ORAL_CAPSULE | Freq: Four times a day (QID) | ORAL | 0 refills | Status: DC

## 2016-07-25 NOTE — Telephone Encounter (Signed)
Spoke with pt in reference to keflex. Pt voiced understanding. Medication sent to pt pharmacy.

## 2016-07-25 NOTE — Telephone Encounter (Signed)
-----   Message from Vanna ScotlandAshley Brandon, MD sent at 07/25/2016  9:57 AM EDT ----- Let go ahead and treat this urine with Keflex 500 mg by mouth 4 times a day x 7 days and we will adjust as needed based on culture results.  Vanna ScotlandAshley Brandon, MD

## 2016-07-29 LAB — CULTURE, URINE COMPREHENSIVE

## 2016-07-30 ENCOUNTER — Telehealth: Payer: Self-pay | Admitting: *Deleted

## 2016-07-30 NOTE — Telephone Encounter (Signed)
-----   Message from [name], MD sent at 07/30/2016 11:07 AM EDT ----- If not already being treated, please treat with Cipro 500 b.i.d. times seven days.

## 2016-07-30 NOTE — Telephone Encounter (Signed)
Spoke with patient and he states already been called and on keflex. Patient ok with current plan.

## 2016-08-08 ENCOUNTER — Telehealth: Payer: Self-pay | Admitting: Urology

## 2016-08-08 DIAGNOSIS — N390 Urinary tract infection, site not specified: Secondary | ICD-10-CM

## 2016-08-08 MED ORDER — CIPROFLOXACIN HCL 500 MG PO TABS: 500.0000 mg | ORAL_TABLET | Freq: Two times a day (BID) | ORAL | 0 refills | Status: DC

## 2016-08-08 NOTE — Telephone Encounter (Signed)
Patient called the office today and left a voice mail message requesting a call back from a nurse.  Please call him at [phone].

## 2016-08-08 NOTE — Telephone Encounter (Signed)
Yes, lets treat him with Cipro bid x 7 days just in case it was resistant to Keflex since that was not specifically tested. If he does not improve after the Cipro, and like to repeat the culture and also do a bladder scan.  [name], MD

## 2016-08-08 NOTE — Telephone Encounter (Signed)
Pt called stating he was given keflex earlier in the month when he presented for a possible UTI. Pt stated that he continues to burn and symptoms do not to be much better. I noticed where you sent a message saying he could have cipro. Do you want me to send that in or have him come back for another specimen?

## 2016-08-08 NOTE — Telephone Encounter (Signed)
Spoke with pt in reference to dysuria and cipro. Made pt aware if s/s do not improve to call back and will reculture urine. Pt voiced understanding.

## 2016-08-23 ENCOUNTER — Ambulatory Visit (INDEPENDENT_AMBULATORY_CARE_PROVIDER_SITE_OTHER): Payer: Medicare Other

## 2016-08-23 VITALS — BP 145/82 | HR 78 | Ht 70.0 in | Wt 212.5 lb

## 2016-08-23 DIAGNOSIS — R300 Dysuria: Secondary | ICD-10-CM | POA: Diagnosis not present

## 2016-08-23 LAB — URINALYSIS, COMPLETE
BILIRUBIN UA: NEGATIVE
Glucose, UA: NEGATIVE
Leukocytes, UA: NEGATIVE
NITRITE UA: NEGATIVE
PH UA: 6 (ref 5.0–7.5)
SPEC GRAV UA: 1.025 (ref 1.005–1.030)
Urobilinogen, Ur: 1 mg/dL (ref 0.2–1.0)

## 2016-08-23 LAB — MICROSCOPIC EXAMINATION
RBC MICROSCOPIC, UA: NONE SEEN /HPF (ref 0–?)
WBC, UA: >30 /hpf — ABNORMAL HIGH (ref 0–?)

## 2016-08-23 NOTE — Progress Notes (Signed)
Pt presents today with c/o dysuria. Pt stated that his UTI like s/s never went away after completing cipro. A clean catch was obtained for u/a and cx.  Blood pressure (!) 145/82, pulse 78, height  (1.778 m), weight 212 lb 8 oz (96.4 kg).

## 2016-08-24 ENCOUNTER — Telehealth: Payer: Self-pay

## 2016-08-24 NOTE — Telephone Encounter (Signed)
Spoke with pt in reference to u/a results and waiting for ucx results. Pt voiced understanding.

## 2016-08-24 NOTE — Telephone Encounter (Signed)
-----   Message from [name], MD sent at 08/24/2016  7:51 AM EDT ----- Urine does appear grossly positive for infection but I would like to wait for the culture given that you were recently treated.  I suspect you have developed a resistance to that medication.    [name], MD

## 2016-08-27 ENCOUNTER — Telehealth: Payer: Self-pay | Admitting: Urology

## 2016-08-27 LAB — CULTURE, URINE COMPREHENSIVE

## 2016-08-27 MED ORDER — NITROFURANTOIN MONOHYD MACRO 100 MG PO CAPS: 100.0000 mg | ORAL_CAPSULE | Freq: Every day | ORAL | 0 refills | Status: DC

## 2016-08-27 MED ORDER — LEVOFLOXACIN 500 MG PO TABS: 500.0000 mg | ORAL_TABLET | Freq: Every day | ORAL | 0 refills | Status: DC

## 2016-08-27 NOTE — Telephone Encounter (Signed)
Patient is growing enterococcus again today in his urine. He continues to have recurrent episodes of UTIs and has had several rounds of antibiotics. His only complaint is dysuria, otherwise he is voiding extremely well and has no complaints.  I recommended treating this UTI again with a 7 day course of Levaquin this time. Thereafter, like him to take daily Macrobid 100 mg for suppression 3 months. He is a follow-up appointment with me in July. He still having issues then, will scope him.   [name], MD

## 2016-08-29 ENCOUNTER — Telehealth: Payer: Self-pay | Admitting: Urology

## 2016-08-29 DIAGNOSIS — N390 Urinary tract infection, site not specified: Secondary | ICD-10-CM

## 2016-08-29 NOTE — Telephone Encounter (Signed)
Yes, please make a note on his next follow-up appointment this it is possible.

## 2016-08-29 NOTE — Telephone Encounter (Signed)
The meds he got filled was nitrofurantoin 90 day supply, very expensive.  Can he get generic?  Please call pt [phone]

## 2016-08-29 NOTE — Telephone Encounter (Signed)
Added cysto in notes section of appt.

## 2016-08-29 NOTE — Telephone Encounter (Signed)
Patient called today to let you know that the ABX you put him on was the same one that  His PCP put him on. And he just wanted you to know that he had taken it in the past for the infection. He doesn't want you to do anything about it. He just wanted you to be aware of it? Also I read your last note on him and you said you would scope him if was still having issues, do you want me to change his appt to a cysto just in case you want to do a cysto at his next app so that it will say that? I can put a note on the app that it will be a possible cysto? That way Maralyn Sago will be prepared for it that day if needed?  Thanks,  Marcelino Duster

## 2016-08-30 MED ORDER — NITROFURANTOIN MONOHYD MACRO 100 MG PO CAPS: 100.0000 mg | ORAL_CAPSULE | Freq: Every day | ORAL | 2 refills | Status: DC

## 2016-08-30 NOTE — Telephone Encounter (Signed)
Spoke with pt in reference to nitrofurantoin and generics. Reinforced with pt nitrofurantoin is generic of macrobid. Pt then stated medication was $300 for 90 days. Pt requested to get a script for 30 days at a time with refills. New rx was given for pt-30 days 2 refills. Pt voiced understanding.

## 2016-11-07 ENCOUNTER — Ambulatory Visit (INDEPENDENT_AMBULATORY_CARE_PROVIDER_SITE_OTHER): Payer: Medicare Other | Admitting: Urology

## 2016-11-07 ENCOUNTER — Telehealth: Payer: Self-pay | Admitting: Radiology

## 2016-11-07 ENCOUNTER — Encounter: Payer: Self-pay | Admitting: Urology

## 2016-11-07 ENCOUNTER — Other Ambulatory Visit: Payer: Self-pay | Admitting: Radiology

## 2016-11-07 VITALS — BP 139/68 | HR 83 | Ht 70.0 in | Wt 210.0 lb

## 2016-11-07 DIAGNOSIS — R338 Other retention of urine: Secondary | ICD-10-CM

## 2016-11-07 DIAGNOSIS — N210 Calculus in bladder: Secondary | ICD-10-CM

## 2016-11-07 DIAGNOSIS — N390 Urinary tract infection, site not specified: Secondary | ICD-10-CM | POA: Diagnosis not present

## 2016-11-07 DIAGNOSIS — N401 Enlarged prostate with lower urinary tract symptoms: Secondary | ICD-10-CM

## 2016-11-07 LAB — URINALYSIS, COMPLETE
BILIRUBIN UA: NEGATIVE
GLUCOSE, UA: NEGATIVE
KETONES UA: NEGATIVE
NITRITE UA: NEGATIVE
SPEC GRAV UA: 1.015 (ref 1.005–1.030)
Urobilinogen, Ur: 0.2 mg/dL (ref 0.2–1.0)
pH, UA: 7 (ref 5.0–7.5)

## 2016-11-07 LAB — MICROSCOPIC EXAMINATION: WBC, UA: >30 /hpf — ABNORMAL HIGH (ref 0–?)

## 2016-11-07 MED ORDER — CIPROFLOXACIN HCL 500 MG PO TABS
500.0000 mg | ORAL_TABLET | Freq: Once | ORAL | Status: AC
  Administered 2016-11-07: 500 mg via ORAL

## 2016-11-07 MED ORDER — LIDOCAINE HCL 2 % EX GEL
1.0000 "application " | Freq: Once | CUTANEOUS | Status: AC
  Administered 2016-11-07: 1 via URETHRAL

## 2016-11-07 NOTE — Progress Notes (Signed)
11/07/2016 2:12 PM   [name] 13-Jul-1940 [id number]  Referring provider: [name], MD [phone] [name] [place], [place] [address]  Chief Complaint  Patient presents with  . 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  He initially presented with severe urinary retention with bilateral hydroureteronephrosis, acute renal failure requiring hospitalization s/p failed VT x 2.    Most recent PSA 2.23 on 07/07/2015.  Rectal exam enlarged, otherwise unremarkable.    Since surgery, he's had numerous UTIs including Klebsiella and enterococcus. He is currently on suppressive Macrobid for 3 months and attempts at breaking the cycle of infections.  PVR today 110.  Overall, he is pleased with his urinary symptoms. He does have mild chronic dysuria related to recurrent infections. He was offered cystoscopy today for further evaluation of etiology of recurrent infections.   PMH: Past Medical History:  Diagnosis Date  . Anemia   . Asthma    childhood asthma  . Enlarged prostate with urinary retention   . GERD (gastroesophageal reflux disease)   . Hypertension     Surgical History: Past Surgical History:  Procedure Laterality Date  . COLONOSCOPY WITH PROPOFOL N/A 08/08/2015   Procedure: COLONOSCOPY WITH PROPOFOL;  Surgeon: Wallace Cullens, MD;  Location[facility] ENDOSCOPY;  Service: Gastroenterology;  Laterality: N/A;  . ESOPHAGOGASTRODUODENOSCOPY (EGD) WITH PROPOFOL N/A 08/08/2015   Procedure: ESOPHAGOGASTRODUODENOSCOPY (EGD) WITH PROPOFOL;  Surgeon: Wallace Cullens, MD;  Location: [facility] ENDOSCOPY;  Service: Gastroenterology;  Laterality: N/A;  . HERNIA REPAIR Right 1962   Inguinal Hernia  . HOLEP-LASER ENUCLEATION OF THE PROSTATE WITH MORCELLATION N/A 03/26/2016   Procedure: HOLEP-LASER ENUCLEATION OF THE PROSTATE WITH MORCELLATION;  Surgeon: [name], MD;  Location: [facility] ORS;   Service: Urology;  Laterality: N/A;  . HOLMIUM LASER APPLICATION N/A 03/26/2016   Procedure: HOLMIUM LASER APPLICATION;  Surgeon: [name], MD;  Location: [facility] ORS;  Service: Urology;  Laterality: N/A;    Home Medications:  Allergies as of 11/07/2016   No Known Allergies     Medication List       Accurate as of 11/07/16 11:59 PM. Always use your most recent med list.          cephALEXin 500 MG capsule Commonly known as:  KEFLEX Take 1 capsule (500 mg total) by mouth 4 (four) times daily.   docusate sodium 100 MG capsule Commonly known as:  COLACE Take 1 capsule (100 mg total) by mouth 2 (two) times daily.   ferrous sulfate 325 (65 FE) MG tablet Take 325 mg by mouth daily with breakfast.   finasteride 5 MG tablet Commonly known as:  PROSCAR Take 1 tablet (5 mg total) by mouth daily.   levofloxacin 500 MG tablet Commonly known as:  LEVAQUIN Take 1 tablet (500 mg total) by mouth daily.   nitrofurantoin (macrocrystal-monohydrate) 100 MG capsule Commonly known as:  MACROBID Take 1 capsule (100 mg total) by mouth at bedtime.   pantoprazole 40 MG tablet Commonly known as:  PROTONIX Take 40 mg by mouth 2 (two) times daily. Take 30 to 45 minutes prior to first meal of the day   potassium chloride 20 MEQ packet Commonly known as:  KLOR-CON Take 20 mEq by mouth daily.   valsartan-hydrochlorothiazide 160-25 MG tablet Commonly known as:  DIOVAN-HCT Take 1 tablet by mouth daily.       Allergies: No Known Allergies  Family  History: Family History  Problem Relation Age of Onset  . Prostate cancer Neg Hx   . Bladder Cancer Neg Hx   . Kidney cancer Neg Hx     Social History:  reports that he has never smoked. He has never used smokeless tobacco. He reports that he does not drink alcohol or use drugs.  ROS:     Physical Exam: BP 139/68   Pulse 83   Ht 5\' 10"  (1.778 m)   Wt 210 lb (95.3 kg)   BMI 30.13 kg/m   Constitutional:  Alert and oriented, No acute  distress.  HEENT: [place] AT, moist mucus membranes.  Trachea midline, no masses. Cardiovascular: No clubbing, cyanosis, or edema.   Respiratory: Normal respiratory effort, no increased work of breathing.  GI: Abdomen is soft, nontender, nondistended, no abdominal masses GU: Circumcised phallus with orthotopic meatus. Bilateral descended testicles, nontender. Skin: No rashes, bruises or suspicious lesions. Neurologic: Grossly intact, no focal deficits, moving all 4 extremities. Psychiatric: Normal mood and affect.  Laboratory Data: Lab Results  Component Value Date   WBC 5.4 11/09/2016   HGB 13.4 11/09/2016   HCT 40.5 11/09/2016   MCV 83.0 11/09/2016   PLT 121 (L) 11/09/2016    Lab Results  Component Value Date   CREATININE 1.36 (H) 03/26/2016    Lab Results  Component Value Date   PSA 3.52 02/12/2016    Pertinent Imaging: Results for orders placed or performed in visit on 11/07/16  CULTURE, URINE COMPREHENSIVE  Result Value Ref Range   Urine Culture, Comprehensive Final report (A)    Organism ID, Bacteria Enterococcus faecalis (A)    ANTIMICROBIAL SUSCEPTIBILITY Comment   Microscopic Examination  Result Value Ref Range   WBC, UA >30 (H) 0 - 5 /hpf   RBC, UA 0-2 0 - 2 /hpf   Epithelial Cells (non renal) 0-10 0 - 10 /hpf   Renal Epithel, UA 0-10 (A) None seen /hpf   Bacteria, UA Few (A) None seen/Few  Urinalysis, Complete  Result Value Ref Range   Specific Gravity, UA 1.015 1.005 - 1.030   pH, UA 7.0 5.0 - 7.5   Color, UA Yellow Yellow   Appearance Ur Cloudy (A) Clear   Leukocytes, UA 3+ (A) Negative   Protein, UA Trace (A) Negative/Trace   Glucose, UA Negative Negative   Ketones, UA Negative Negative   RBC, UA Trace (A) Negative   Bilirubin, UA Negative Negative   Urobilinogen, Ur 0.2 0.2 - 1.0 mg/dL   Nitrite, UA Negative Negative   Microscopic Examination See below:     Procedure:   Patient was administered oral antibiotic. He was prepped in the sterile  fashion. Lidocaine jelly was administered.  Flexible cystoscope was introduced per urethra into the bladder. The defect could be seen within the prostatic urethra which is fairly patent. There was some mildly friable mucosa within the prostate. Bladder neck was no longer elevated. Just within the bladder, there was a calculus, approximately 2 cm with fairly significant debris in the bladder. The bladder mucosa was unble to be scope was then removed. Procedure was well tolerated. evaluated due to visualization.  Assessment & Plan:    1. Urinary retention/ incomplete bladder emptying S/p HoLEP 11/17 now voiding independently PVR mildly elevated today but significantly improved, suspect chronic bladder changes 2/2 longstanding outlet obstruction Continue finasteride, may ultimately wean in future  2. BPH with obstruction/lower urinary tract symptoms Massively enlarged prostate- 200 cc based on CT scan S/p HoLEP  3. Recurrent UTI UA today suspicious for UTI Stone likely etiology for ongoing infections, see below Preop urine culture and treat prior to surgery -UCx  4. Bladder stone- Stone within bladder which I suspect is calcified residual prostate tissue is not completely clear to the time of holmium laser enucleation of the prostate   this is likely etiology for recurrent infections Findings discussed with the patient I recommended proceeding to the operating room for cystolitholapaxy, risk and benefits discussed with the patient in detail He is agreeable to plan  Schedule cystolitholapaxy   Vanna ScotlandAshley Reylene Stauder, MD  [facility] [address]  [place], [place] [id number] [phone]

## 2016-11-07 NOTE — Telephone Encounter (Signed)
Pt has been scheduled for cystolitholapaxy with Dr Apolinar JunesBrandon on 11/19/16. Pt has been informed of surgery & pre-admit testing appt. Questions answered. Pt voices understanding.

## 2016-11-09 ENCOUNTER — Encounter
Admission: RE | Admit: 2016-11-09 | Discharge: 2016-11-09 | Disposition: A | Discharge status: Home / Self Care | Payer: Medicare Other | Source: Ambulatory Visit | Attending: Urology | Admitting: Urology

## 2016-11-09 DIAGNOSIS — D649 Anemia, unspecified: Secondary | ICD-10-CM | POA: Diagnosis not present

## 2016-11-09 DIAGNOSIS — I10 Essential (primary) hypertension: Secondary | ICD-10-CM | POA: Diagnosis not present

## 2016-11-09 DIAGNOSIS — J45909 Unspecified asthma, uncomplicated: Secondary | ICD-10-CM | POA: Insufficient documentation

## 2016-11-09 DIAGNOSIS — Z01818 Encounter for other preprocedural examination: Secondary | ICD-10-CM | POA: Insufficient documentation

## 2016-11-09 LAB — POTASSIUM: POTASSIUM: 4 mmol/L (ref 3.5–5.1)

## 2016-11-09 LAB — CBC
HCT: 40.5 % (ref 40.0–52.0)
HEMOGLOBIN: 13.4 g/dL (ref 13.0–18.0)
MCH: 27.3 pg (ref 26.0–34.0)
MCHC: 33 g/dL (ref 32.0–36.0)
MCV: 83 fL (ref 80.0–100.0)
Platelets: 121 10*3/uL — ABNORMAL LOW (ref 150–440)
RBC: 4.89 MIL/uL (ref 4.40–5.90)
RDW: 15.7 % — ABNORMAL HIGH (ref 11.5–14.5)
WBC: 5.4 10*3/uL (ref 3.8–10.6)

## 2016-11-09 NOTE — Patient Instructions (Signed)
  Your procedure is scheduled ZO:XWRUEAon:Monday July 23 , 2018. Report to Same Day Surgery. To find out your arrival time please call [phone] between 1PM - 3PM on Friday November 16, 2016.  Remember: Instructions that are not followed completely may result in serious medical risk, up to and including death, or upon the discretion of your surgeon and anesthesiologist your surgery may need to be rescheduled.    _x___ 1. Do not eat food or drink liquids after midnight. No gum chewing or hard candies.     ____ 2. No Alcohol for 24 hours before or after surgery.   ____ 3. Bring all medications with you on the day of surgery if instructed.    __x__ 4. Notify your doctor if there is any change in your medical condition     (cold, fever, infections).    _____ 5. No smoking 24 hours prior to surgery.     Do not wear jewelry, make-up, hairpins, clips or nail polish.  Do not wear lotions, powders, or perfumes.   Do not shave 48 hours prior to surgery. Men may shave face and neck.  Do not bring valuables to the hospital.    [facility] is not responsible for any belongings or valuables.               Contacts, dentures or bridgework may not be worn into surgery.  Leave your suitcase in the car. After surgery it may be brought to your room.  For patients admitted to the hospital, discharge time is determined by your treatment team.   Patients discharged the day of surgery will not be allowed to drive home.    Please read over the following fact sheets that you were given:   [facility] Pre[facility]  _x___ Take these medicines the morning of surgery with A SIP OF WATER:    1. pantoprazole (PROTONIX) take at bedtime the night prior to surgery and the morning of surgery.   ____ Fleet Enema (as directed)   ____ Use CHG Soap as directed on instruction sheet  ____ Use inhalers on the day of surgery and bring to hospital day of surgery  ____ Stop metformin 2 days prior to surgery    ____  Take 1/2 of usual insulin dose the night before surgery and none on the morning of surgery.   ____ Stop Coumadin/Plavix/aspirin on does not apply.  ____ Stop Anti-inflammatories such as Advil, Aleve, Ibuprofen, Motrin, Naproxen,  Naprosyn, Goodies powders or aspirin  products. OK to take Tylenol.   ____ Stop supplements until after surgery.    ____ Bring C-Pap to the hospital.

## 2016-11-10 LAB — CULTURE, URINE COMPREHENSIVE

## 2016-11-12 ENCOUNTER — Other Ambulatory Visit: Payer: Self-pay | Admitting: Radiology

## 2016-11-12 ENCOUNTER — Telehealth: Payer: Self-pay | Admitting: Radiology

## 2016-11-12 MED ORDER — CIPROFLOXACIN HCL 500 MG PO TABS: 500.0000 mg | ORAL_TABLET | Freq: Two times a day (BID) | ORAL | 0 refills | Status: AC

## 2016-11-12 MED ORDER — CIPROFLOXACIN HCL 500 MG PO TABS: 500.0000 mg | ORAL_TABLET | Freq: Two times a day (BID) | ORAL | 0 refills | Status: DC

## 2016-11-12 NOTE — Telephone Encounter (Signed)
-----   Message from Vanna ScotlandAshley Brandon, MD sent at 11/10/2016  1:53 PM EDT ----- Please treat with cipro 500 mg bid x 7 days starting 5 days prior to surgery  Vanna ScotlandAshley Brandon, MD

## 2016-11-12 NOTE — Telephone Encounter (Signed)
Pt states he is currently taking nitrofurantoin. Should he continue this along with the Cipro? Please advise.

## 2016-11-12 NOTE — Telephone Encounter (Signed)
Script sent to pharmacy. Advised pt to begin medication on 11/14/16. Pt voices understanding.

## 2016-11-12 NOTE — Telephone Encounter (Signed)
Hold macrobid while taking cipro.    Vanna ScotlandAshley Nilson Tabora, MD

## 2016-11-13 NOTE — Telephone Encounter (Signed)
Advised pt to hold macrobid while taking Cipro. Pt voices understanding.

## 2016-11-18 MED ORDER — CIPROFLOXACIN IN D5W 400 MG/200ML IV SOLN
400.0000 mg | INTRAVENOUS | Status: AC
  Administered 2016-11-19: 400 mg via INTRAVENOUS

## 2016-11-19 ENCOUNTER — Encounter: Payer: Self-pay | Admitting: *Deleted

## 2016-11-19 ENCOUNTER — Ambulatory Visit
Admission: RE | Admit: 2016-11-19 | Discharge: 2016-11-19 | Disposition: A | Discharge status: Home / Self Care | Payer: Medicare Other | Source: Ambulatory Visit | Attending: Urology | Admitting: Urology

## 2016-11-19 ENCOUNTER — Ambulatory Visit: Payer: Medicare Other | Admitting: Certified Registered Nurse Anesthetist

## 2016-11-19 ENCOUNTER — Encounter
Admission: RE | Disposition: A | Discharge status: Home / Self Care | Payer: Self-pay | Source: Ambulatory Visit | Attending: Urology

## 2016-11-19 DIAGNOSIS — I10 Essential (primary) hypertension: Secondary | ICD-10-CM | POA: Insufficient documentation

## 2016-11-19 DIAGNOSIS — N320 Bladder-neck obstruction: Secondary | ICD-10-CM | POA: Diagnosis not present

## 2016-11-19 DIAGNOSIS — N1330 Unspecified hydronephrosis: Secondary | ICD-10-CM

## 2016-11-19 DIAGNOSIS — K219 Gastro-esophageal reflux disease without esophagitis: Secondary | ICD-10-CM | POA: Diagnosis not present

## 2016-11-19 DIAGNOSIS — N210 Calculus in bladder: Secondary | ICD-10-CM | POA: Diagnosis present

## 2016-11-19 DIAGNOSIS — Z79899 Other long term (current) drug therapy: Secondary | ICD-10-CM | POA: Diagnosis not present

## 2016-11-19 DIAGNOSIS — N401 Enlarged prostate with lower urinary tract symptoms: Secondary | ICD-10-CM | POA: Diagnosis not present

## 2016-11-19 DIAGNOSIS — N138 Other obstructive and reflux uropathy: Secondary | ICD-10-CM | POA: Insufficient documentation

## 2016-11-19 DIAGNOSIS — R338 Other retention of urine: Secondary | ICD-10-CM

## 2016-11-19 DIAGNOSIS — Z87440 Personal history of urinary (tract) infections: Secondary | ICD-10-CM | POA: Diagnosis not present

## 2016-11-19 DIAGNOSIS — N179 Acute kidney failure, unspecified: Secondary | ICD-10-CM | POA: Diagnosis not present

## 2016-11-19 DIAGNOSIS — N136 Pyonephrosis: Secondary | ICD-10-CM | POA: Diagnosis not present

## 2016-11-19 HISTORY — PX: HOLMIUM LASER APPLICATION: SHX5852

## 2016-11-19 HISTORY — PX: CYSTOSCOPY W/ RETROGRADES: SHX1426

## 2016-11-19 HISTORY — PX: CYSTOSCOPY WITH LITHOLAPAXY: SHX1425

## 2016-11-19 SURGERY — CYSTOSCOPY, WITH BLADDER CALCULUS LITHOLAPAXY
Anesthesia: General | Site: Ureter | Wound class: Clean Contaminated

## 2016-11-19 MED ORDER — FENTANYL CITRATE (PF) 100 MCG/2ML IJ SOLN
INTRAMUSCULAR | Status: AC
  Filled 2016-11-19: qty 2

## 2016-11-19 MED ORDER — SUCCINYLCHOLINE CHLORIDE 20 MG/ML IJ SOLN
INTRAMUSCULAR | Status: AC
  Filled 2016-11-19: qty 1

## 2016-11-19 MED ORDER — EPHEDRINE SULFATE 50 MG/ML IJ SOLN
INTRAMUSCULAR | Status: AC
  Filled 2016-11-19: qty 1

## 2016-11-19 MED ORDER — ONDANSETRON HCL 4 MG/2ML IJ SOLN: 4.0000 mg | Freq: Once | INTRAMUSCULAR | Status: DC | PRN

## 2016-11-19 MED ORDER — IOTHALAMATE MEGLUMINE 17.2 % UR SOLN
URETHRAL | Status: DC | PRN
  Administered 2016-11-19: 10 mL via URETHRAL

## 2016-11-19 MED ORDER — ROCURONIUM BROMIDE 100 MG/10ML IV SOLN
INTRAVENOUS | Status: DC | PRN
  Administered 2016-11-19: 30 mg via INTRAVENOUS
  Administered 2016-11-19: 10 mg via INTRAVENOUS

## 2016-11-19 MED ORDER — PROPOFOL 10 MG/ML IV BOLUS
INTRAVENOUS | Status: DC | PRN
  Administered 2016-11-19: 70 mg via INTRAVENOUS
  Administered 2016-11-19: 130 mg via INTRAVENOUS

## 2016-11-19 MED ORDER — SUCCINYLCHOLINE CHLORIDE 20 MG/ML IJ SOLN
INTRAMUSCULAR | Status: DC | PRN
  Administered 2016-11-19: 100 mg via INTRAVENOUS

## 2016-11-19 MED ORDER — DEXAMETHASONE SODIUM PHOSPHATE 10 MG/ML IJ SOLN
INTRAMUSCULAR | Status: DC | PRN
  Administered 2016-11-19: 10 mg via INTRAVENOUS

## 2016-11-19 MED ORDER — LIDOCAINE HCL (PF) 2 % IJ SOLN
INTRAMUSCULAR | Status: AC
  Filled 2016-11-19: qty 2

## 2016-11-19 MED ORDER — LACTATED RINGERS IV SOLN
INTRAVENOUS | Status: DC
  Administered 2016-11-19: 10:00:00 via INTRAVENOUS

## 2016-11-19 MED ORDER — CIPROFLOXACIN IN D5W 400 MG/200ML IV SOLN
INTRAVENOUS | Status: AC
  Filled 2016-11-19: qty 200

## 2016-11-19 MED ORDER — HYDROCODONE-ACETAMINOPHEN 5-325 MG PO TABS
1.0000 | ORAL_TABLET | Freq: Four times a day (QID) | ORAL | Status: DC | PRN
  Administered 2016-11-19: 1 via ORAL

## 2016-11-19 MED ORDER — EPHEDRINE SULFATE 50 MG/ML IJ SOLN
INTRAMUSCULAR | Status: DC | PRN
  Administered 2016-11-19 (×3): 10 mg via INTRAVENOUS

## 2016-11-19 MED ORDER — HYDROCODONE-ACETAMINOPHEN 5-325 MG PO TABS: 1.0000 | ORAL_TABLET | Freq: Four times a day (QID) | ORAL | 0 refills | Status: DC | PRN

## 2016-11-19 MED ORDER — PROPOFOL 10 MG/ML IV BOLUS
INTRAVENOUS | Status: AC
  Filled 2016-11-19: qty 20

## 2016-11-19 MED ORDER — ONDANSETRON HCL 4 MG/2ML IJ SOLN
INTRAMUSCULAR | Status: DC | PRN
  Administered 2016-11-19: 4 mg via INTRAVENOUS

## 2016-11-19 MED ORDER — SUGAMMADEX SODIUM 200 MG/2ML IV SOLN
INTRAVENOUS | Status: DC | PRN
  Administered 2016-11-19: 200 mg via INTRAVENOUS

## 2016-11-19 MED ORDER — MIDAZOLAM HCL 2 MG/2ML IJ SOLN
INTRAMUSCULAR | Status: DC | PRN
  Administered 2016-11-19: 2 mg via INTRAVENOUS

## 2016-11-19 MED ORDER — MIDAZOLAM HCL 2 MG/2ML IJ SOLN
INTRAMUSCULAR | Status: AC
  Filled 2016-11-19: qty 2

## 2016-11-19 MED ORDER — FENTANYL CITRATE (PF) 100 MCG/2ML IJ SOLN
INTRAMUSCULAR | Status: DC | PRN
  Administered 2016-11-19: 50 ug via INTRAVENOUS

## 2016-11-19 MED ORDER — FENTANYL CITRATE (PF) 100 MCG/2ML IJ SOLN: 25.0000 ug | INTRAMUSCULAR | Status: DC | PRN

## 2016-11-19 MED ORDER — ROCURONIUM BROMIDE 50 MG/5ML IV SOLN
INTRAVENOUS | Status: AC
  Filled 2016-11-19: qty 1

## 2016-11-19 MED ORDER — ONDANSETRON HCL 4 MG/2ML IJ SOLN
INTRAMUSCULAR | Status: AC
  Filled 2016-11-19: qty 2

## 2016-11-19 MED ORDER — HYDROCODONE-ACETAMINOPHEN 5-325 MG PO TABS
ORAL_TABLET | ORAL | Status: AC
  Filled 2016-11-19: qty 1

## 2016-11-19 MED ORDER — DEXAMETHASONE SODIUM PHOSPHATE 10 MG/ML IJ SOLN
INTRAMUSCULAR | Status: AC
  Filled 2016-11-19: qty 1

## 2016-11-19 MED ORDER — SUGAMMADEX SODIUM 200 MG/2ML IV SOLN
INTRAVENOUS | Status: AC
  Filled 2016-11-19: qty 2

## 2016-11-19 SURGICAL SUPPLY — 19 items
BAG DRAIN CYSTO-URO LG1000N (MISCELLANEOUS) ×4 IMPLANT
BASKET ZERO TIP 1.9FR (BASKET) ×2 IMPLANT
BSKT STON RTRVL ZERO TP 1.9FR (BASKET)
CATH COUDE FOLEY 2W 5CC 18FR (CATHETERS) ×2 IMPLANT
CATH URETL 5X70 OPEN END (CATHETERS) ×2 IMPLANT
CATH URETL OPEN END 4X70 (CATHETERS) ×2 IMPLANT
FIBER LASER 550 (Laser) ×2 IMPLANT
GLOVE BIO SURGEON STRL SZ 6.5 (GLOVE) ×3 IMPLANT
GLOVE BIO SURGEONS STRL SZ 6.5 (GLOVE) ×1
GOWN STRL REUS W/ TWL LRG LVL3 (GOWN DISPOSABLE) ×4 IMPLANT
GOWN STRL REUS W/TWL LRG LVL3 (GOWN DISPOSABLE) ×8
KIT RM TURNOVER CYSTO AR (KITS) ×4 IMPLANT
PACK CYSTO AR (MISCELLANEOUS) ×4 IMPLANT
SENSORWIRE 0.038 NOT ANGLED (WIRE) ×4
SET IRRIG Y TYPE TUR BLADDER L (SET/KITS/TRAYS/PACK) ×4 IMPLANT
SYRINGE IRR TOOMEY STRL 70CC (SYRINGE) ×4 IMPLANT
WATER STERILE IRR 1000ML POUR (IV SOLUTION) ×4 IMPLANT
WATER STERILE IRR 3000ML UROMA (IV SOLUTION) ×4 IMPLANT
WIRE SENSOR 0.038 NOT ANGLED (WIRE) IMPLANT

## 2016-11-19 NOTE — H&P (View-Only) (Signed)
11/07/2016 2:12 PM   [name] 13-Jul-1940 [id number]  Referring provider: [name], MD [phone] [name] [place], [place] [address]  Chief Complaint  Patient presents with  . 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  He initially presented with severe urinary retention with bilateral hydroureteronephrosis, acute renal failure requiring hospitalization s/p failed VT x 2.    Most recent PSA 2.23 on 07/07/2015.  Rectal exam enlarged, otherwise unremarkable.    Since surgery, he's had numerous UTIs including Klebsiella and enterococcus. He is currently on suppressive Macrobid for 3 months and attempts at breaking the cycle of infections.  PVR today 110.  Overall, he is pleased with his urinary symptoms. He does have mild chronic dysuria related to recurrent infections. He was offered cystoscopy today for further evaluation of etiology of recurrent infections.   PMH: Past Medical History:  Diagnosis Date  . Anemia   . Asthma    childhood asthma  . Enlarged prostate with urinary retention   . GERD (gastroesophageal reflux disease)   . Hypertension     Surgical History: Past Surgical History:  Procedure Laterality Date  . COLONOSCOPY WITH PROPOFOL N/A 08/08/2015   Procedure: COLONOSCOPY WITH PROPOFOL;  Surgeon: Wallace Cullens, MD;  Location[facility];  Service: Gastroenterology;  Laterality: N/A;  . ESOPHAGOGASTRODUODENOSCOPY (EGD) WITH PROPOFOL N/A 08/08/2015   Procedure: ESOPHAGOGASTRODUODENOSCOPY (EGD) WITH PROPOFOL;  Surgeon: Wallace Cullens, MD;  Location: [facility] ENDOSCOPY;  Service: Gastroenterology;  Laterality: N/A;  . HERNIA REPAIR Right 1962   Inguinal Hernia  . HOLEP-LASER ENUCLEATION OF THE PROSTATE WITH MORCELLATION N/A 03/26/2016   Procedure: HOLEP-LASER ENUCLEATION OF THE PROSTATE WITH MORCELLATION;  Surgeon: [name], MD;  Location: [facility] ORS;   Service: Urology;  Laterality: N/A;  . HOLMIUM LASER APPLICATION N/A 03/26/2016   Procedure: HOLMIUM LASER APPLICATION;  Surgeon: [name], MD;  Location: [facility] ORS;  Service: Urology;  Laterality: N/A;    Home Medications:  Allergies as of 11/07/2016   No Known Allergies     Medication List       Accurate as of 11/07/16 11:59 PM. Always use your most recent med list.          cephALEXin 500 MG capsule Commonly known as:  KEFLEX Take 1 capsule (500 mg total) by mouth 4 (four) times daily.   docusate sodium 100 MG capsule Commonly known as:  COLACE Take 1 capsule (100 mg total) by mouth 2 (two) times daily.   ferrous sulfate 325 (65 FE) MG tablet Take 325 mg by mouth daily with breakfast.   finasteride 5 MG tablet Commonly known as:  PROSCAR Take 1 tablet (5 mg total) by mouth daily.   levofloxacin 500 MG tablet Commonly known as:  LEVAQUIN Take 1 tablet (500 mg total) by mouth daily.   nitrofurantoin (macrocrystal-monohydrate) 100 MG capsule Commonly known as:  MACROBID Take 1 capsule (100 mg total) by mouth at bedtime.   pantoprazole 40 MG tablet Commonly known as:  PROTONIX Take 40 mg by mouth 2 (two) times daily. Take 30 to 45 minutes prior to first meal of the day   potassium chloride 20 MEQ packet Commonly known as:  KLOR-CON Take 20 mEq by mouth daily.   valsartan-hydrochlorothiazide 160-25 MG tablet Commonly known as:  DIOVAN-HCT Take 1 tablet by mouth daily.       Allergies: No Known Allergies  Family  History: Family History  Problem Relation Age of Onset  . Prostate cancer Neg Hx   . Bladder Cancer Neg Hx   . Kidney cancer Neg Hx     Social History:  reports that he has never smoked. He has never used smokeless tobacco. He reports that he does not drink alcohol or use drugs.  ROS:     Physical Exam: BP 139/68   Pulse 83   Ht 5\' 10"  (1.778 m)   Wt 210 lb (95.3 kg)   BMI 30.13 kg/m   Constitutional:  Alert and oriented, No acute  distress.  HEENT: [place] AT, moist mucus membranes.  Trachea midline, no masses. Cardiovascular: No clubbing, cyanosis, or edema.   Respiratory: Normal respiratory effort, no increased work of breathing.  GI: Abdomen is soft, nontender, nondistended, no abdominal masses GU: Circumcised phallus with orthotopic meatus. Bilateral descended testicles, nontender. Skin: No rashes, bruises or suspicious lesions. Neurologic: Grossly intact, no focal deficits, moving all 4 extremities. Psychiatric: Normal mood and affect.  Laboratory Data: Lab Results  Component Value Date   WBC 5.4 11/09/2016   HGB 13.4 11/09/2016   HCT 40.5 11/09/2016   MCV 83.0 11/09/2016   PLT 121 (L) 11/09/2016    Lab Results  Component Value Date   CREATININE 1.36 (H) 03/26/2016    Lab Results  Component Value Date   PSA 3.52 02/12/2016    Pertinent Imaging: Results for orders placed or performed in visit on 11/07/16  CULTURE, URINE COMPREHENSIVE  Result Value Ref Range   Urine Culture, Comprehensive Final report (A)    Organism ID, Bacteria Enterococcus faecalis (A)    ANTIMICROBIAL SUSCEPTIBILITY Comment   Microscopic Examination  Result Value Ref Range   WBC, UA >30 (H) 0 - 5 /hpf   RBC, UA 0-2 0 - 2 /hpf   Epithelial Cells (non renal) 0-10 0 - 10 /hpf   Renal Epithel, UA 0-10 (A) None seen /hpf   Bacteria, UA Few (A) None seen/Few  Urinalysis, Complete  Result Value Ref Range   Specific Gravity, UA 1.015 1.005 - 1.030   pH, UA 7.0 5.0 - 7.5   Color, UA Yellow Yellow   Appearance Ur Cloudy (A) Clear   Leukocytes, UA 3+ (A) Negative   Protein, UA Trace (A) Negative/Trace   Glucose, UA Negative Negative   Ketones, UA Negative Negative   RBC, UA Trace (A) Negative   Bilirubin, UA Negative Negative   Urobilinogen, Ur 0.2 0.2 - 1.0 mg/dL   Nitrite, UA Negative Negative   Microscopic Examination See below:     Procedure:   Patient was administered oral antibiotic. He was prepped in the sterile  fashion. Lidocaine jelly was administered.  Flexible cystoscope was introduced per urethra into the bladder. The defect could be seen within the prostatic urethra which is fairly patent. There was some mildly friable mucosa within the prostate. Bladder neck was no longer elevated. Just within the bladder, there was a calculus, approximately 2 cm with fairly significant debris in the bladder. The bladder mucosa was unble to be scope was then removed. Procedure was well tolerated. evaluated due to visualization.  Assessment & Plan:    1. Urinary retention/ incomplete bladder emptying S/p HoLEP 11/17 now voiding independently PVR mildly elevated today but significantly improved, suspect chronic bladder changes 2/2 longstanding outlet obstruction Continue finasteride, may ultimately wean in future  2. BPH with obstruction/lower urinary tract symptoms Massively enlarged prostate- 200 cc based on CT scan S/p HoLEP  3. Recurrent UTI UA today suspicious for UTI Stone likely etiology for ongoing infections, see below Preop urine culture and treat prior to surgery -UCx  4. Bladder stone- Stone within bladder which I suspect is calcified residual prostate tissue is not completely clear to the time of holmium laser enucleation of the prostate   this is likely etiology for recurrent infections Findings discussed with the patient I recommended proceeding to the operating room for cystolitholapaxy, risk and benefits discussed with the patient in detail He is agreeable to plan  Schedule cystolitholapaxy   Vanna ScotlandAshley Haydyn Girvan, MD  [facility] [address]  [place], [place] [address] [phone]

## 2016-11-19 NOTE — Anesthesia Postprocedure Evaluation (Signed)
Anesthesia Post Note  Patient: Glen Spencer  Procedure(s) Performed: Procedure(s) (LRB): CYSTOSCOPY WITH LITHOLAPAXY (N/A) HOLMIUM LASER APPLICATION (N/A) CYSTOSCOPY WITH RETROGRADE PYELOGRAM (Bilateral)  Patient location during evaluation: PACU Anesthesia Type: General Level of consciousness: oriented and awake and alert Pain management: pain level controlled Vital Signs Assessment: post-procedure vital signs reviewed and stable Respiratory status: spontaneous breathing Cardiovascular status: blood pressure returned to baseline Anesthetic complications: no     Last Vitals:  Vitals:   11/19/16 1135 11/19/16 1145  BP:  (!) 151/90  Pulse: 74 77  Resp: 18 18  Temp:      Last Pain:  Vitals:   11/19/16 1129  TempSrc:   PainSc: Asleep                 Tris Howell

## 2016-11-19 NOTE — Anesthesia Preprocedure Evaluation (Addendum)
Anesthesia Evaluation  Patient identified by MRN, date of birth, ID band Patient awake    Reviewed: Allergy & Precautions, NPO status , Patient's Chart, lab work & pertinent test results  Airway Mallampati: II  TM Distance: >3 FB Neck ROM: Limited    Dental  (+) Teeth Intact   Pulmonary asthma ,    Pulmonary exam normal        Cardiovascular hypertension, Normal cardiovascular exam     Neuro/Psych negative neurological ROS  negative psych ROS   GI/Hepatic GERD  Medicated and Controlled,  Endo/Other    Renal/GU Renal disease     Musculoskeletal negative musculoskeletal ROS (+)   Abdominal Normal abdominal exam  (+)   Peds negative pediatric ROS (+)  Hematology  (+) anemia ,   Anesthesia Other Findings Past Medical History: No date: Anemia No date: Asthma     Comment:  childhood asthma No date: Enlarged prostate with urinary retention No date: GERD (gastroesophageal reflux disease) No date: Hypertension ARF resolved  Reproductive/Obstetrics                            Anesthesia Physical  Anesthesia Plan  ASA: III  Anesthesia Plan: General   Post-op Pain Management:    Induction: Intravenous  PONV Risk Score and Plan:   Airway Management Planned: LMA and Oral ETT  Additional Equipment:   Intra-op Plan:   Post-operative Plan: Extubation in OR  Informed Consent: I have reviewed the patients History and Physical, chart, labs and discussed the procedure including the risks, benefits and alternatives for the proposed anesthesia with the patient or authorized representative who has indicated his/her understanding and acceptance.   Dental advisory given  Plan Discussed with: CRNA and Surgeon  Anesthesia Plan Comments:        Anesthesia Quick Evaluation

## 2016-11-19 NOTE — Interval H&P Note (Signed)
History and Physical Interval Note:  11/19/2016 9:58 AM  Glen Spencer  has presented today for surgery, with the diagnosis of BLADDER STONE  The various methods of treatment have been discussed with the patient and family. After consideration of risks, benefits and other options for treatment, the patient has consented to  Procedure(s): CYSTOSCOPY WITH LITHOLAPAXY (N/A) HOLMIUM LASER APPLICATION (N/A) as a surgical intervention .  The patient's history has been reviewed, patient examined, no change in status, stable for surgery.  I have reviewed the patient's chart and labs.  Questions were answered to the patient's satisfaction.    RRR CTAB  Being treated for UTI   Vanna ScotlandAshley Ayaansh Smail

## 2016-11-19 NOTE — Anesthesia Procedure Notes (Signed)
Procedure Name: Intubation Date/Time: 11/19/2016 10:26 AM Performed by: Darlyne Russian Pre-anesthesia Checklist: Patient identified, Emergency Drugs available, Suction available, Patient being monitored and Timeout performed Patient Re-evaluated:Patient Re-evaluated prior to induction Oxygen Delivery Method: Circle system utilized Preoxygenation: Pre-oxygenation with 100% oxygen Ventilation: Mask ventilation without difficulty Laryngoscope Size: Mac and 4 Grade View: Grade II Tube type: Oral Tube size: 7.5 mm Number of attempts: 1 Airway Equipment and Method: Stylet Placement Confirmation: ETT inserted through vocal cords under direct vision,  breath sounds checked- equal and bilateral and positive ETCO2 Secured at: 23 cm Tube secured with: Tape Dental Injury: Teeth and Oropharynx as per pre-operative assessment

## 2016-11-19 NOTE — Transfer of Care (Signed)
Immediate Anesthesia Transfer of Care Note  Patient: Glen GriffithsRaymond E Leinen  Procedure(s) Performed: Procedure(s): CYSTOSCOPY WITH LITHOLAPAXY (N/A) HOLMIUM LASER APPLICATION (N/A) CYSTOSCOPY WITH RETROGRADE PYELOGRAM (Bilateral)  Patient Location: PACU  Anesthesia Type:General  Level of Consciousness: sedated  Airway & Oxygen Therapy: Patient connected to face mask oxygen  Post-op Assessment: Post -op Vital signs reviewed and stable  Post vital signs: stable  Last Vitals:  Vitals:   11/19/16 1129 11/19/16 1130  BP: 133/73 133/73  Pulse: 70 70  Resp: 10 14  Temp: (!) 36.2 C     Last Pain:  Vitals:   11/19/16 0951  TempSrc: Oral  PainSc: 0-No pain         Complications: No apparent anesthesia complications

## 2016-11-19 NOTE — Anesthesia Post-op Follow-up Note (Cosign Needed)
Anesthesia QCDR form completed.        

## 2016-11-19 NOTE — Op Note (Signed)
Date of procedure: 11/19/16  Preoperative diagnosis:  1. Bladder stone 2. BPH with bladder outlet obstruction 3. Recurrent urinary tract infections   Postoperative diagnosis:  1. Same as above 2. Mild left hydroureteronephrosis   Procedure: 1. Cystolitholapaxy 2. Bilateral retrograde pyelogram  Surgeon: Vanna ScotlandAshley Deshae Dickison, MD  Anesthesia: General  Complications: None  Intraoperative findings: 4 x 2 cm amorphous calcified bladder foreign body most consistent with previously resected/ necrotic prostate tissue with calcification. Left UO slightly stenotic appearing, retrograde showed mild left hydroureteronephrosis with dilation down to the level of the UVJ. There was drainage of this kidney but sluggish when compared to the right.  EBL: Minimal  Specimens: Calcified prostate tissue  Drains: 1618 French coud with 10 cc balloon  Indication: Glen FatRaymond Spencer is a 76 y.o. patient with a history of massive BPH status post holmium laser enucleation procedure in 02/2016. Prior to this, he was in urinary retention. Since surgery, he has been voiding well with an excellent urinary stream. He continues, however, to have dysuria or urinary tract infections. On cystoscopy, he was noted to have an amorphous calcified bladder stone presumably residual prostate tissue from his previous surgery which is now calcified. His counseled to undergo removal of this. .  After reviewing the management options for treatment, he elected to proceed with the above surgical procedure(s). We have discussed the potential benefits and risks of the procedure, side effects of the proposed treatment, the likelihood of the patient achieving the goals of the procedure, and any potential problems that might occur during the procedure or recuperation. Informed consent has been obtained.  Description of procedure:  The patient was taken to the operating room and general anesthesia was induced.  The patient was placed in the dorsal  lithotomy position, prepped and draped in the usual sterile fashion, and preoperative antibiotics were administered. A preoperative time-out was performed.   A 26 French resectoscope was advanced per urethra using a visual obturator. Of note, there was some narrowing at his bulbar urethra and some mild trauma upon catheter placement. The prostatic fossa was noted to have bilobar coaptation at the apex but the remainder of the fossa was widely patent along with a wide open bladder neck. The bladder was moderately to heavily trabeculated with large saccules. Within the bladder, there was approximately 4 cm x 2 cm stone. A 500  laser fiber was then brought in and using settings of 1.5 J and 30 Hz, began to attempt to laser the stone. Immediately noted, with the stone was very soft, amorphous, and most consistent with necrotic calcified prostate tissue likely residual from previous holmium laser enucleation. I was able to shrink down the size of the soft mass by desiccating it using the laser fiber and ultimately was able to extract the specimen en bloc. Upon reinspection of the bladder, the left UO had a somewhat stenotic appearance and after quite some time of watching this orifice, no significant drainage was noted. Additionally noted, just adjacent to this, there was a small mucosal injury of the bladder from the laser fiber. In order to ensure the patency of the ureter, I decannulated using a wire and then a 5 [place] open-ended ureteral catheter just within the UO. Retrograde pyelogram on the side revealed mild hydroureteronephrosis all the way down to the level of the bladder. There was some bulbous irregular changes without significant filling defects in the distal aspect of the ureter. This is most consistent with chronic UVJ obstruction. After watching this ureter for a  few minutes, it was seen to be draining fairly quickly with some retained material in the left kidney. Hydronephrosis did appear to resolve  upon watching the drainage. For comparison, a left retrograde pyelogram was performed. This revealed no hydronephrosis on this side but there was distal hydroureter again down to the level of the UVJ. This was dissected be chronic. Drainage was more brisk on the side compared to the left. This incision was made not to place ureteral stent given that there was in fact drainage and this appeared to be somewhat chronic. The bladder was then drained. Given the need for dilation of the bulbar urethra and some slight dramatic injury here, the decision was made to place this 61 [place] coud for 72 hours postoperatively. The balloon was filled 10 cc of sterile water. The patient was then cleaned and dried, repositioned supine position, reversed from anesthesia, and taken to the PACU in stable condition. There were no complications in this case.  Plan: He will return on Thursday for a voiding trial. He'll follow-up with me in 1 month with renal ultrasound just prior to evaluate for ongoing residual hydroureteronephrosis.  All findings were discussed with his wife is agreeable with this plan.  [name], M.D.

## 2016-11-19 NOTE — Discharge Instructions (Signed)
Indwelling Urinary Catheter Care, Adult Take good care of your catheter to keep it working and to prevent problems. How to wear your catheter Attach your catheter to your leg with tape (adhesive tape) or a leg strap. Make sure it is not too tight. If you use tape, remove any bits of tape that are already on the catheter. How to wear a drainage bag You should have:  A large overnight bag.  A small leg bag.  Overnight Bag You may wear the overnight bag at any time. Always keep the bag below the level of your bladder but off the floor. When you sleep, put a clean plastic bag in a wastebasket. Then hang the bag inside the wastebasket. Leg Bag Never wear the leg bag at night. Always wear the leg bag below your knee. Keep the leg bag secure with a leg strap or tape. How to care for your skin  Clean the skin around the catheter at least once every day.  Shower every day. Do not take baths.  Put creams, lotions, or ointments on your genital area only as told by your doctor.  Do not use powders, sprays, or lotions on your genital area. How to clean your catheter and your skin 1. Wash your hands with soap and water. 2. Wet a washcloth in warm water and gentle (mild) soap. 3. Use the washcloth to clean the skin where the catheter enters your body. Clean downward and wipe away from the catheter in small circles. Do not wipe toward the catheter. 4. Pat the area dry with a clean towel. Make sure to clean off all soap. How to care for your drainage bags Empty your drainage bag when it is ?- full or at least 2-3 times a day. Replace your drainage bag once a month or sooner if it starts to smell bad or look dirty. Do not clean your drainage bag unless told by your doctor. Emptying a drainage bag  Supplies Needed  Rubbing alcohol.  Gauze pad or cotton ball.  Tape or a leg strap.  Steps 1. Wash your hands with soap and water. 2. Separate (detach) the bag from your leg. 3. Hold the bag over  the toilet or a clean container. Keep the bag below your hips and bladder. This stops pee (urine) from going back into the tube. 4. Open the pour spout at the bottom of the bag. 5. Empty the pee into the toilet or container. Do not let the pour spout touch any surface. 6. Put rubbing alcohol on a gauze pad or cotton ball. 7. Use the gauze pad or cotton ball to clean the pour spout. 8. Close the pour spout. 9. Attach the bag to your leg with tape or a leg strap. 10. Wash your hands.  Changing a drainage bag Supplies Needed  Alcohol wipes.  A clean drainage bag.  Adhesive tape or a leg strap.  Steps 1. Wash your hands with soap and water. 2. Separate the dirty bag from your leg. 3. Pinch the rubber catheter with your fingers so that pee does not spill out. 4. Separate the catheter tube from the drainage tube where these tubes connect (at the connection valve). Do not let the tubes touch any surface. 5. Clean the end of the catheter tube with an alcohol wipe. Use a different alcohol wipe to clean the end of the drainage tube. 6. Connect the catheter tube to the drainage tube of the clean bag. 7. Attach the new bag to  the leg with adhesive tape or a leg strap. 8. Wash your hands.  How to prevent infection and other problems  Never pull on your catheter or try to remove it. Pulling can damage tissue in your body.  Always wash your hands before and after touching your catheter.  If a leg strap gets wet, replace it with a dry one.  Drink enough fluids to keep your pee clear or pale yellow, or as told by your doctor.  Do not let the drainage bag or tubing touch the floor.  Wear cotton underwear.  If you are male, wipe from front to back after you poop (have a bowel movement).  Check on the catheter often to make sure it works and the tubing is not twisted. Get help if:  Your pee is cloudy.  Your pee smells unusually bad.  Your pee is not draining into the bag.  Your  tube gets clogged.  Your catheter starts to leak.  Your bladder feels full. Get help right away if:  You have redness, swelling, or pain where the catheter enters your body.  You have fluid, pus, or a bad smell coming from the area where the catheter enters your body.  The area where the catheter enters your body feels warm.  You have a fever.  You have pain in your: ? Stomach (abdomen). ? Legs. ? Lower back. ? Bladder.  You see blood fill the catheter.  Your pee is pink or red.  You feel sick to your stomach (nauseous).  You throw up (vomit).  You have chills.  Your catheter gets pulled out. This information is not intended to replace advice given to you by your health care provider. Make sure you discuss any questions you have with your health care provider. Document Released: 08/11/2012 Document Revised: 03/14/2016 Document Reviewed: 09/29/2013 Elsevier Interactive Patient Education  2018 [employer]   AMBULATORY SURGERY  DISCHARGE INSTRUCTIONS   1) The drugs that you were given will stay in your system until tomorrow so for the next 24 hours you should not:  A) Drive an automobile B) Make any legal decisions C) Drink any alcoholic beverage   2) You may resume regular meals tomorrow.  Today it is better to start with liquids and gradually work up to solid foods.  You may eat anything you prefer, but it is better to start with liquids, then soup and crackers, and gradually work up to solid foods.   3) Please notify your doctor immediately if you have any unusual bleeding, trouble breathing, redness and pain at the surgery site, drainage, fever, or pain not relieved by medication.    4) Additional Instructions: TAKE A STOOL SOFTENER TWICE A DAY WHILE TAKING NARCOTIC PAIN MEDICINE TO PREVENT CONSTIPATION   Please contact your physician with any problems or Same Day Surgery at [phone], Monday through Friday 6 am to 4 pm, or [place] [facility]  Main number at [phone].

## 2016-11-20 LAB — SURGICAL PATHOLOGY

## 2016-11-22 ENCOUNTER — Ambulatory Visit: Payer: Medicare Other

## 2016-11-22 DIAGNOSIS — N401 Enlarged prostate with lower urinary tract symptoms: Secondary | ICD-10-CM

## 2016-11-22 DIAGNOSIS — R338 Other retention of urine: Principal | ICD-10-CM

## 2016-11-22 NOTE — Progress Notes (Signed)
Catheter Removal  Patient is present today for a catheter removal.  10ml of water was drained from the balloon. A 18FR coude foley cath was removed from the bladder no complications were noted . Patient tolerated well.  Preformed by: S. Watts, CMA and C. Rana SnareLowe, CMA  Follow up/ Additional notes: w/ Dr. Apolinar JunesBrandon in 1 month

## 2016-12-17 ENCOUNTER — Ambulatory Visit
Admission: RE | Admit: 2016-12-17 | Discharge: 2016-12-17 | Disposition: A | Discharge status: Home / Self Care | Payer: Medicare Other | Source: Ambulatory Visit | Attending: Urology | Admitting: Urology

## 2016-12-17 DIAGNOSIS — Z87441 Personal history of nephrotic syndrome: Secondary | ICD-10-CM | POA: Insufficient documentation

## 2016-12-17 DIAGNOSIS — N1330 Unspecified hydronephrosis: Secondary | ICD-10-CM

## 2016-12-17 DIAGNOSIS — N401 Enlarged prostate with lower urinary tract symptoms: Secondary | ICD-10-CM | POA: Diagnosis present

## 2016-12-17 DIAGNOSIS — R338 Other retention of urine: Secondary | ICD-10-CM | POA: Diagnosis not present

## 2016-12-17 DIAGNOSIS — Z09 Encounter for follow-up examination after completed treatment for conditions other than malignant neoplasm: Secondary | ICD-10-CM | POA: Diagnosis not present

## 2016-12-17 DIAGNOSIS — N210 Calculus in bladder: Secondary | ICD-10-CM | POA: Diagnosis present

## 2016-12-21 ENCOUNTER — Ambulatory Visit (INDEPENDENT_AMBULATORY_CARE_PROVIDER_SITE_OTHER): Payer: Medicare Other | Admitting: Urology

## 2016-12-21 ENCOUNTER — Encounter: Payer: Self-pay | Admitting: Urology

## 2016-12-21 VITALS — BP 151/82 | HR 73 | Ht 70.0 in | Wt 210.0 lb

## 2016-12-21 DIAGNOSIS — R300 Dysuria: Secondary | ICD-10-CM | POA: Diagnosis not present

## 2016-12-21 LAB — URINALYSIS, COMPLETE
BILIRUBIN UA: NEGATIVE
GLUCOSE, UA: NEGATIVE
Ketones, UA: NEGATIVE
Leukocytes, UA: NEGATIVE
NITRITE UA: NEGATIVE
PH UA: 7 (ref 5.0–7.5)
PROTEIN UA: NEGATIVE
RBC UA: NEGATIVE
Specific Gravity, UA: 1.01 (ref 1.005–1.030)
UUROB: 0.2 mg/dL (ref 0.2–1.0)

## 2016-12-21 LAB — MICROSCOPIC EXAMINATION
BACTERIA UA: NONE SEEN
RBC, UA: NONE SEEN /hpf (ref 0–?)

## 2016-12-21 NOTE — Progress Notes (Signed)
12/21/2016 3:40 PM   [name] 1941/03/28 [id number]  Referring provider: [name], MD [name] [place], [place] [address]  Chief Complaint  Patient presents with  . Results    94mo RUS    HPI: 76 year old male with massive BPH with history of urinary retention (preop 178 cc prostate) s/p HoLEP on 03/26/16 (76 g resected) With recurrent urinary tract infections postoperatively found to have residual calcified prostate specimen within the bladder. He returned to the operating room on 11/19/2016 for removal of this calcified tissue.  He initially presented with severe urinary retention with bilateral hydroureteronephrosis, acute renal failure requiring hospitalization s/p failed VT x 2.    Most recent PSA 2.23 on 07/07/2015.  Rectal exam enlarged, otherwise unremarkable.    Today, he is very pleased with his urinary symptoms. He has very minimal dysuria terminally with urination which is improving daily. No hematuria. No urgency or frequency. He feels like something his bladder well.  He has completed 3 months of Macrobid ppx, has not taken this medication now in several weeks.  UA today is unremarkable.  PMH: Past Medical History:  Diagnosis Date  . Anemia   . Asthma    childhood asthma  . Enlarged prostate with urinary retention   . GERD (gastroesophageal reflux disease)   . Hypertension     Surgical History: Past Surgical History:  Procedure Laterality Date  . COLONOSCOPY WITH PROPOFOL N/A 08/08/2015   Procedure: COLONOSCOPY WITH PROPOFOL;  Surgeon: Wallace Cullens, MD;  Location: [facility] ENDOSCOPY;  Service: Gastroenterology;  Laterality: N/A;  . CYSTOSCOPY W/ RETROGRADES Bilateral 11/19/2016   Procedure: CYSTOSCOPY WITH RETROGRADE PYELOGRAM;  Surgeon: [name], MD;  Location: [facility] ORS;  Service: Urology;  Laterality: Bilateral;  . CYSTOSCOPY WITH LITHOLAPAXY N/A 11/19/2016   Procedure: CYSTOSCOPY WITH LITHOLAPAXY;  Surgeon: [name], MD;   Location: [facility] ORS;  Service: Urology;  Laterality: N/A;  . ESOPHAGOGASTRODUODENOSCOPY (EGD) WITH PROPOFOL N/A 08/08/2015   Procedure: ESOPHAGOGASTRODUODENOSCOPY (EGD) WITH PROPOFOL;  Surgeon: Wallace Cullens, MD;  Location: [facility] ENDOSCOPY;  Service: Gastroenterology;  Laterality: N/A;  . HERNIA REPAIR Right 1962   Inguinal Hernia  . HOLEP-LASER ENUCLEATION OF THE PROSTATE WITH MORCELLATION N/A 03/26/2016   Procedure: HOLEP-LASER ENUCLEATION OF THE PROSTATE WITH MORCELLATION;  Surgeon: [name], MD;  Location: [facility] ORS;  Service: Urology;  Laterality: N/A;  . HOLMIUM LASER APPLICATION N/A 03/26/2016   Procedure: HOLMIUM LASER APPLICATION;  Surgeon: [name], MD;  Location: [facility] ORS;  Service: Urology;  Laterality: N/A;  . HOLMIUM LASER APPLICATION N/A 11/19/2016   Procedure: HOLMIUM LASER APPLICATION;  Surgeon: [name], MD;  Location: [facility] ORS;  Service: Urology;  Laterality: N/A;    Home Medications:  Allergies as of 12/21/2016   No Known Allergies     Medication List       Accurate as of 12/21/16 11:59 PM. Always use your most recent med list.          ferrous sulfate 325 (65 FE) MG tablet Take 325 mg by mouth daily with breakfast.   finasteride 5 MG tablet Commonly known as:  PROSCAR Take 1 tablet (5 mg total) by mouth daily.   pantoprazole 40 MG tablet Commonly known as:  PROTONIX Take 40 mg by mouth 2 (two) times daily. Take 30 to 45 minutes prior to first meal of the day   potassium chloride 20 MEQ packet Commonly known as:  KLOR-CON Take 20 mEq by mouth daily.   valsartan-hydrochlorothiazide 160-25 MG  tablet Commonly known as:  DIOVAN-HCT Take 1 tablet by mouth daily.            Discharge Care Instructions        Start     Ordered   12/21/16 0000  Urinalysis, Complete     12/21/16 0917   12/21/16 0000  Microscopic Examination     12/21/16 0000      Allergies: No Known Allergies  Family History: Family History  Problem Relation Age of  Onset  . Prostate cancer Neg Hx   . Bladder Cancer Neg Hx   . Kidney cancer Neg Hx     Social History:  reports that he has never smoked. He has never used smokeless tobacco. He reports that he does not drink alcohol or use drugs.  ROS: UROLOGY Frequent Urination?: No Hard to postpone urination?: No Burning/pain with urination?: Yes Get up at night to urinate?: No Leakage of urine?: No Urine stream starts and stops?: No Trouble starting stream?: No Do you have to strain to urinate?: No Blood in urine?: No Urinary tract infection?: No Sexually transmitted disease?: No Injury to kidneys or bladder?: No Painful intercourse?: No Weak stream?: No Erection problems?: No Penile pain?: No Gastrointestinal Nausea?: No Vomiting?: No Indigestion/heartburn?: No Diarrhea?: No Constipation?: No Constitutional Fever: No Night sweats?: No Weight loss?: No Fatigue?: No Skin Skin rash/lesions?: No Itching?: No Eyes Blurred vision?: No Double vision?: No Ears/Nose/Throat Sore throat?: No Sinus problems?: Yes Hematologic/Lymphatic Swollen glands?: No Easy bruising?: No Cardiovascular Leg swelling?: No Chest pain?: No Respiratory Cough?: No Shortness of breath?: No Endocrine Excessive thirst?: No Musculoskeletal Back pain?: No Joint pain?: No Neurological Headaches?: No Dizziness?: No Psychologic Depression?: No Anxiety?: No   Physical Exam: BP (!) 151/82   Pulse 73   Ht 5\' 10"  (1.778 m)   Wt 210 lb (95.3 kg)   BMI 30.13 kg/m   Constitutional:  Alert and oriented, No acute distress.   Accompanied by granddaughter today. HEENT: [place] AT, moist mucus membranes.  Trachea midline, no masses. Cardiovascular: No clubbing, cyanosis, or edema.   Respiratory: Normal respiratory effort, no increased work of breathing.  GI: Abdomen is soft, nontender, nondistended, no abdominal masses Skin: No rashes, bruises or suspicious lesions. Neurologic: Grossly intact, no focal  deficits, moving all 4 extremities. Psychiatric: Normal mood and affect.  Laboratory Data: Lab Results  Component Value Date   WBC 5.4 11/09/2016   HGB 13.4 11/09/2016   HCT 40.5 11/09/2016   MCV 83.0 11/09/2016   PLT 121 (L) 11/09/2016    Lab Results  Component Value Date   CREATININE 1.36 (H) 03/26/2016    Lab Results  Component Value Date   PSA 3.52 02/12/2016    Results for orders placed or performed in visit on 12/21/16  Microscopic Examination  Result Value Ref Range   WBC, UA 0-5 0 - 5 /hpf   RBC, UA None seen 0 - 2 /hpf   Epithelial Cells (non renal) 0-10 0 - 10 /hpf   Bacteria, UA None seen None seen/Few  Urinalysis, Complete  Result Value Ref Range   Specific Gravity, UA 1.010 1.005 - 1.030   pH, UA 7.0 5.0 - 7.5   Color, UA Yellow Yellow   Appearance Ur Clear Clear   Leukocytes, UA Negative Negative   Protein, UA Negative Negative/Trace   Glucose, UA Negative Negative   Ketones, UA Negative Negative   RBC, UA Negative Negative   Bilirubin, UA Negative Negative   Urobilinogen,  Ur 0.2 0.2 - 1.0 mg/dL   Nitrite, UA Negative Negative   Microscopic Examination See below:     Pertinent Imaging: n/a  Assessment & Plan:    1. Urinary retention/ incomplete bladder emptying S/p HoLEP 11/17 now voiding independently Continue finasteride, may ultimately wean in future Post void residual chronically mildly elevated, we'll continue to follow but improved from preop  2. BPH with obstruction/lower urinary tract symptoms Massively enlarged prostate- 200 cc based on CT scan S/p HoLEP  3. Recurrent UTI Urinalysis today completely negative-no longer appears to be infected as on all previous occasions   Return in about 6 months (around 06/23/2017) for PVR, IPSS, PSA .   [name], MD  [facility] [address]  [place], [place] [address] [phone]

## 2017-06-25 ENCOUNTER — Encounter: Payer: Self-pay | Admitting: Urology

## 2017-06-25 ENCOUNTER — Ambulatory Visit (INDEPENDENT_AMBULATORY_CARE_PROVIDER_SITE_OTHER): Payer: Medicare Other | Admitting: Urology

## 2017-06-25 VITALS — BP 157/77 | HR 70 | Ht 70.0 in | Wt 227.0 lb

## 2017-06-25 DIAGNOSIS — N400 Enlarged prostate without lower urinary tract symptoms: Secondary | ICD-10-CM | POA: Diagnosis not present

## 2017-06-25 LAB — BLADDER SCAN AMB NON-IMAGING

## 2017-06-25 NOTE — Progress Notes (Signed)
06/25/2017 9:47 AM   [place] 07-14-1940 [id number]  Referring provider: [name], MD [phone] [place], [place] [address]  Chief Complaint  Patient presents with  . Benign Prostatic Hypertrophy    34month    HPI: 77 year old male with massive BPH with history of urinary retention (preop 178 cc prostate) s/p HoLEP on 03/26/16 (76 g resected) with recurrent urinary tract infections postoperatively found to have residual calcified prostate specimen within the bladder. He returned to the operating room on 11/19/2016 for removal of this calcified tissue.  He initially presented with severe urinary retention with bilateral hydroureteronephrosis, acute renal failure requiring hospitalization s/p failed VT x 2.    Most recent PSA 2.23 on 07/07/2015.  Rectal exam enlarged, otherwise unremarkable.  PSA repeated today.    Today, he is very pleased with his urinary symptoms. No further dysuria or UTIs since last visit.  No hematuria. No urgency or frequency. PVR today only 15 cc.  He is extremely please with this results.    Continues on finasteride.    PMH: Past Medical History:  Diagnosis Date  . Anemia   . Asthma    childhood asthma  . Enlarged prostate with urinary retention   . GERD (gastroesophageal reflux disease)   . Hypertension     Surgical History: Past Surgical History:  Procedure Laterality Date  . COLONOSCOPY WITH PROPOFOL N/A 08/08/2015   Procedure: COLONOSCOPY WITH PROPOFOL;  Surgeon: Wallace Cullens, MD;  Location: [facility] ENDOSCOPY;  Service: Gastroenterology;  Laterality: N/A;  . CYSTOSCOPY W/ RETROGRADES Bilateral 11/19/2016   Procedure: CYSTOSCOPY WITH RETROGRADE PYELOGRAM;  Surgeon: [name], MD;  Location: [facility] ORS;  Service: Urology;  Laterality: Bilateral;  . CYSTOSCOPY WITH LITHOLAPAXY N/A 11/19/2016   Procedure: CYSTOSCOPY WITH LITHOLAPAXY;  Surgeon: [name], MD;  Location: [facility] ORS;  Service: Urology;  Laterality: N/A;  .  ESOPHAGOGASTRODUODENOSCOPY (EGD) WITH PROPOFOL N/A 08/08/2015   Procedure: ESOPHAGOGASTRODUODENOSCOPY (EGD) WITH PROPOFOL;  Surgeon: Wallace Cullens, MD;  Location: [facility] ENDOSCOPY;  Service: Gastroenterology;  Laterality: N/A;  . HERNIA REPAIR Right 1962   Inguinal Hernia  . HOLEP-LASER ENUCLEATION OF THE PROSTATE WITH MORCELLATION N/A 03/26/2016   Procedure: HOLEP-LASER ENUCLEATION OF THE PROSTATE WITH MORCELLATION;  Surgeon: [name], MD;  Location: [facility] ORS;  Service: Urology;  Laterality: N/A;  . HOLMIUM LASER APPLICATION N/A 03/26/2016   Procedure: HOLMIUM LASER APPLICATION;  Surgeon: [name], MD;  Location: [facility] ORS;  Service: Urology;  Laterality: N/A;  . HOLMIUM LASER APPLICATION N/A 11/19/2016   Procedure: HOLMIUM LASER APPLICATION;  Surgeon: [name], MD;  Location: [facility] ORS;  Service: Urology;  Laterality: N/A;    Home Medications:  Allergies as of 06/25/2017   No Known Allergies     Medication List        Accurate as of 06/25/17  9:47 AM. Always use your most recent med list.          ferrous sulfate 325 (65 FE) MG tablet Take 325 mg by mouth daily with breakfast.   pantoprazole 40 MG tablet Commonly known as:  PROTONIX Take 40 mg by mouth 2 (two) times daily. Take 30 to 45 minutes prior to first meal of the day   potassium chloride 20 MEQ packet Commonly known as:  KLOR-CON Take 20 mEq by mouth daily.   valsartan-hydrochlorothiazide 160-25 MG tablet Commonly known as:  DIOVAN-HCT Take 1 tablet by mouth daily.       Allergies: No Known Allergies  Family History:  Family History  Problem Relation Age of Onset  . Prostate cancer Neg Hx   . Bladder Cancer Neg Hx   . Kidney cancer Neg Hx     Social History:  reports that  has never smoked. he has never used smokeless tobacco. He reports that he does not drink alcohol or use drugs.  ROS: UROLOGY Frequent Urination?: No Hard to postpone urination?: No Burning/pain with urination?: No Get up  at night to urinate?: No Leakage of urine?: No Urine stream starts and stops?: No Trouble starting stream?: No Do you have to strain to urinate?: No Blood in urine?: No Urinary tract infection?: No Sexually transmitted disease?: No Injury to kidneys or bladder?: No Painful intercourse?: No Weak stream?: No Erection problems?: No Penile pain?: No Gastrointestinal Nausea?: No Vomiting?: No Indigestion/heartburn?: No Diarrhea?: No Constipation?: No Constitutional Fever: No Night sweats?: No Weight loss?: No Fatigue?: No Skin Skin rash/lesions?: No Itching?: No Eyes Blurred vision?: No Double vision?: No Ears/Nose/Throat Sore throat?: No Sinus problems?: Yes Hematologic/Lymphatic Swollen glands?: No Easy bruising?: No Cardiovascular Leg swelling?: No Chest pain?: No Respiratory Cough?: No Shortness of breath?: No Endocrine Excessive thirst?: No Musculoskeletal Back pain?: No Joint pain?: No Neurological Headaches?: No Dizziness?: No Psychologic Depression?: No Anxiety?: No   Physical Exam: BP (!) 157/77   Pulse 70   Ht 5\' 10"  (1.778 m)   Wt 227 lb (103 kg)   BMI 32.57 kg/m   Constitutional:  Alert and oriented, No acute distress.   Accompanied by granddaughter today. HEENT: [place] AT, moist mucus membranes.  Trachea midline, no masses. Cardiovascular: No clubbing, cyanosis, or edema.   Respiratory: Normal respiratory effort, no increased work of breathing.  Skin: No rashes, bruises or suspicious lesions. Neurologic: Grossly intact, no focal deficits, moving all 4 extremities. Psychiatric: Normal mood and affect.  Laboratory Data: Lab Results  Component Value Date   WBC 5.4 11/09/2016   HGB 13.4 11/09/2016   HCT 40.5 11/09/2016   MCV 83.0 11/09/2016   PLT 121 (L) 11/09/2016    Lab Results  Component Value Date   CREATININE 1.36 (H) 03/26/2016    Lab Results  Component Value Date   PSA 3.52 02/12/2016    Pertinent  Imaging: n/a  Assessment & Plan:    1. Urinary retention/ incomplete bladder emptying S/p HoLEP 11/17 now voiding independently Stop finasteride Excellent bladder emptying today  2. BPH with obstruction/lower urinary tract symptoms Massively enlarged prostate- 200 cc based on CT scan PSA today for new baseline   Return in about 1 year (around 06/25/2018).   Vanna ScotlandAshley Daleen Steinhaus, MD  [facility] [address]  [place], [place] [address] [phone]

## 2017-06-26 ENCOUNTER — Telehealth: Payer: Self-pay

## 2017-06-26 LAB — PSA: PROSTATE SPECIFIC AG, SERUM: 1.5 ng/mL (ref 0.0–4.0)

## 2017-06-26 NOTE — Telephone Encounter (Signed)
Letter sent.

## 2017-06-26 NOTE — Telephone Encounter (Signed)
-----   Message from Vanna ScotlandAshley Brandon, MD sent at 06/26/2017  9:42 AM EST ----- PSA is excellent.  See you next year.   Vanna ScotlandAshley Brandon, MD

## 2017-12-18 IMAGING — CT CT ABD-PELV W/O CM
2 of 4 series · 16 of 46 positions shown, 18 images · non-contrast
Comparison: 03/04/2013 CT

CLINICAL DATA: 75-year-old male with bilateral flank and abdominal
pain for 1 week.

EXAM:
CT ABDOMEN AND PELVIS WITHOUT CONTRAST
TECHNIQUE: Multidetector CT imaging of the abdomen and pelvis was performed
following the standard protocol without IV contrast.

[Series 2: routine soft tissue · axial · 0.80mm/px · z∈[-777,-327]mm · 13 of 100 slices shown, 15 images]
[im 5/100  soft-tissue]
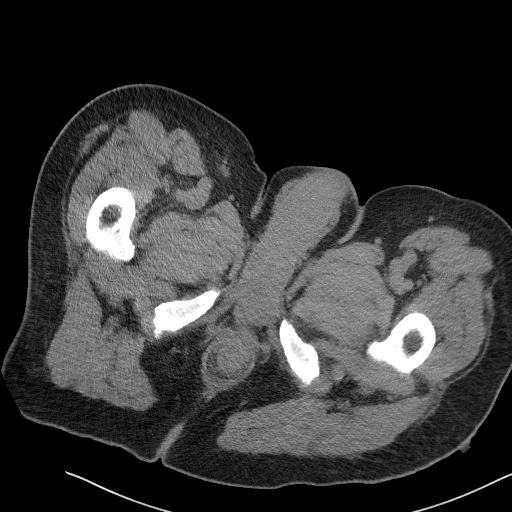
[im 5/100  bone]
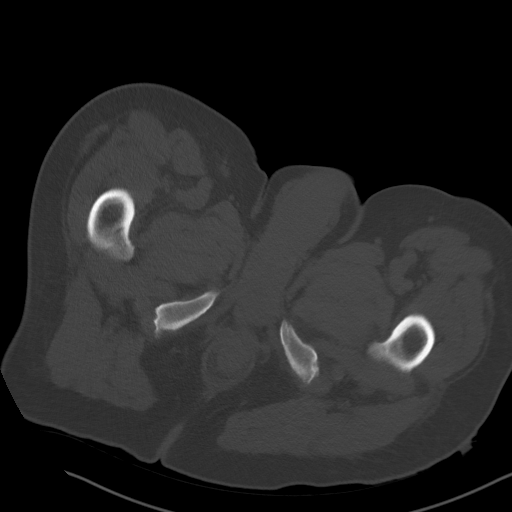
[im 13/100  soft-tissue]
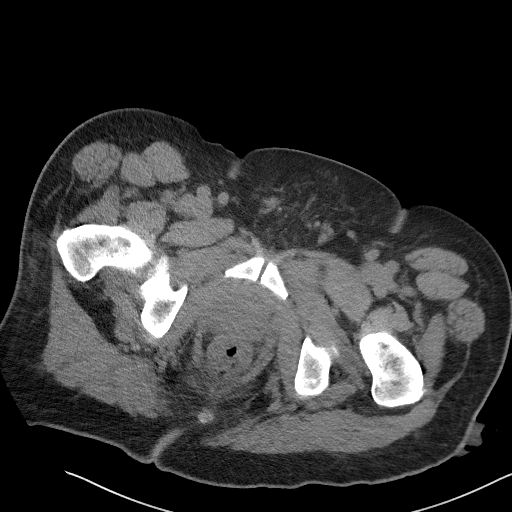
[im 21/100  soft-tissue]
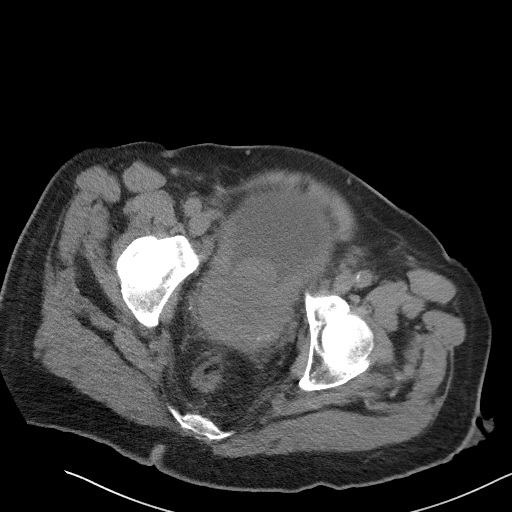
[im 29/100  soft-tissue]
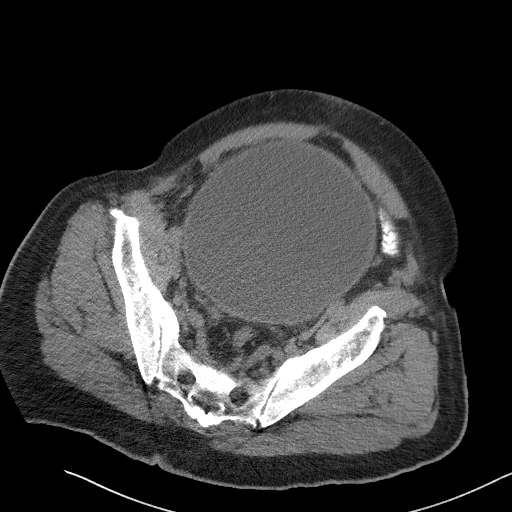
[im 34/100  soft-tissue]
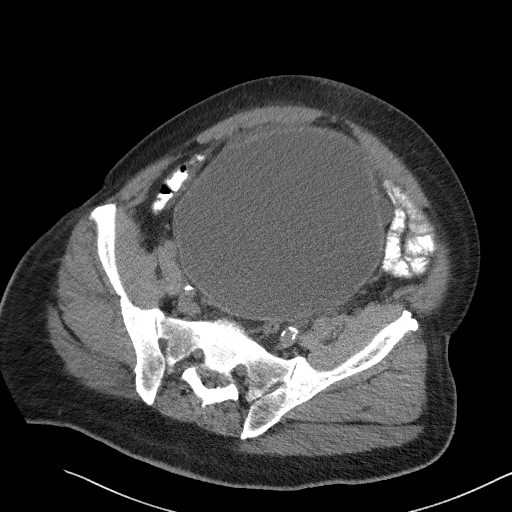
[im 42/100  soft-tissue]
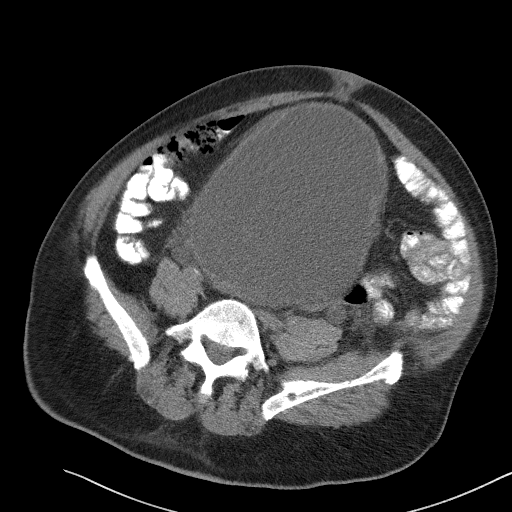
[im 50/100  soft-tissue]
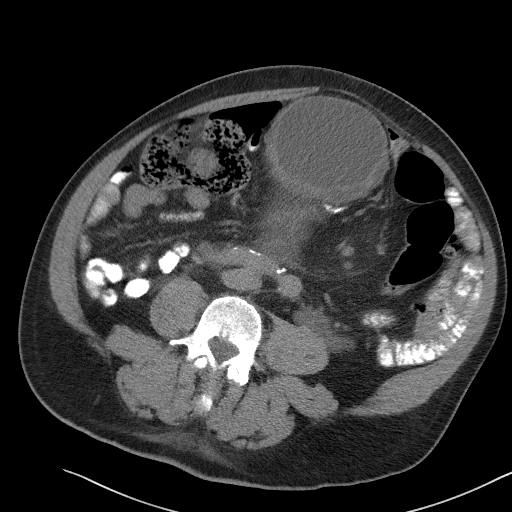
[im 58/100  soft-tissue]
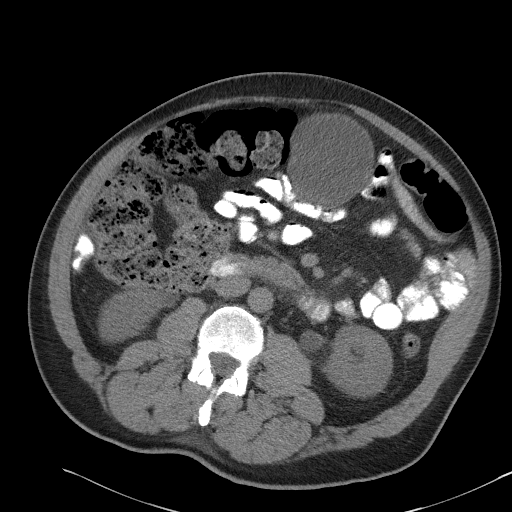
[im 67/100  soft-tissue]
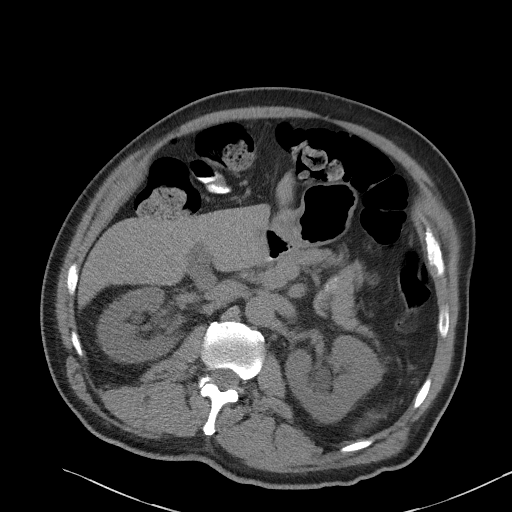
[im 67/100  bone]
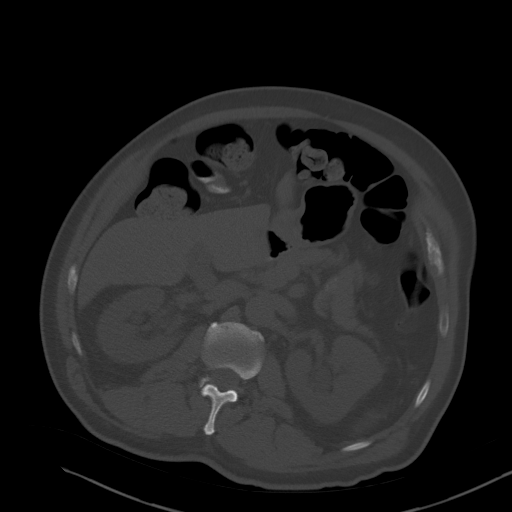
[im 71/100  soft-tissue]
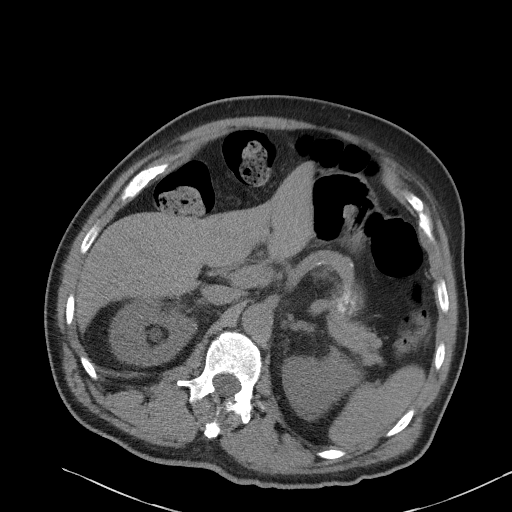
[im 79/100  soft-tissue]
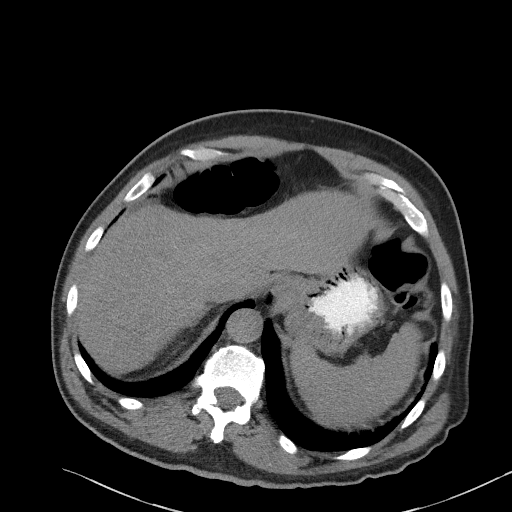
[im 87/100  soft-tissue]
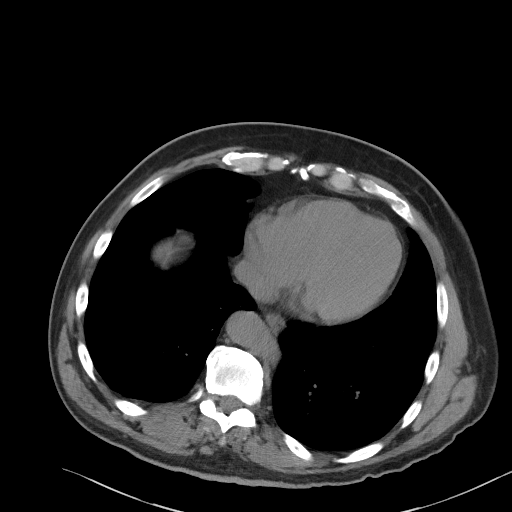
[im 95/100  soft-tissue]
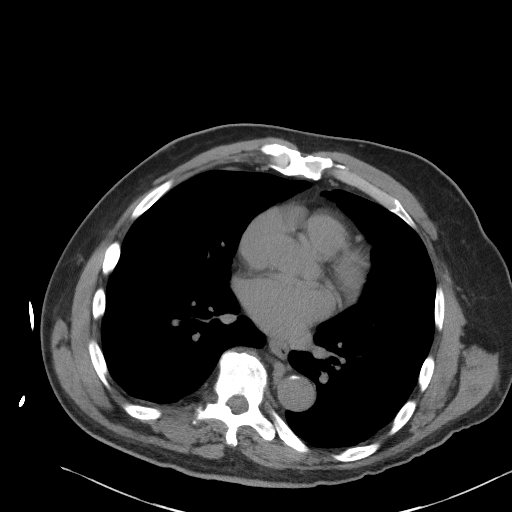

[Series 5: coronal st · coronal · 0.86mm/px · 3 of 112 slices shown]
[im 38/112  soft-tissue]
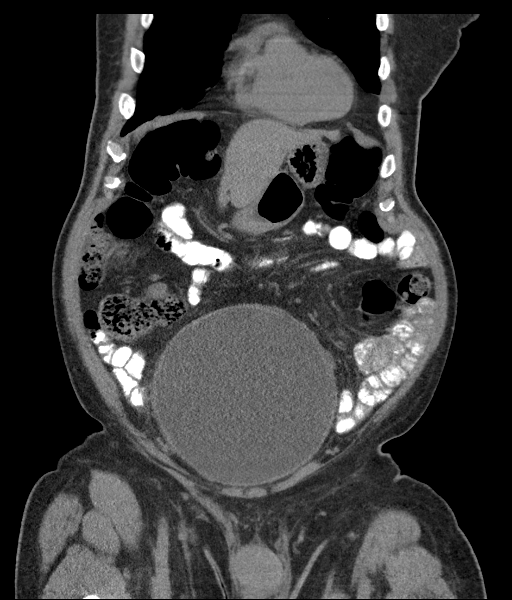
[im 50/112  soft-tissue]
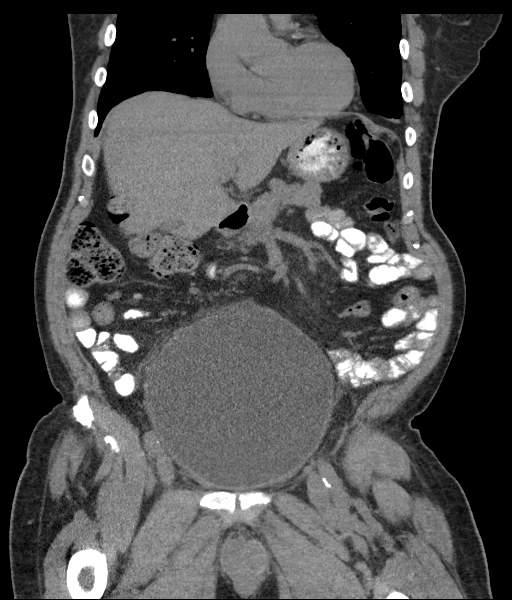
[im 62/112  soft-tissue]
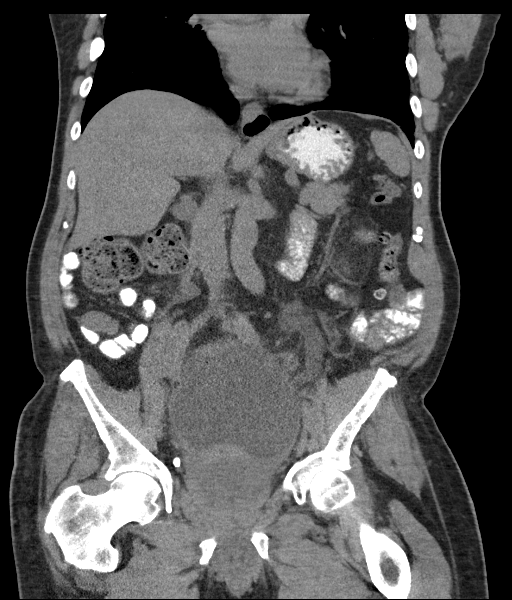

[16 of 46 positions shown; findings below may reference images not displayed]

FINDINGS: Please note that parenchymal abnormalities may be missed without
intravenous contrast.

Lower chest: No acute abnormality.

Hepatobiliary: The liver and gallbladder are unremarkable. There is
no evidence of biliary dilatation.

Pancreas: Unremarkable

Spleen: Unremarkable

Adrenals/Urinary Tract: Marked bladder distention identified with
mild to moderate bilateral hydronephrosis. Mild circumferential
bladder wall thickening with trabeculation noted. No urinary calculi
are identified.

The adrenal glands are unremarkable.

Stomach/Bowel: There is no evidence of bowel obstruction or definite
bowel wall thickening. The appendix is normal.

Vascular/Lymphatic: No significant vascular findings are present. No
enlarged abdominal or pelvic lymph nodes.

Reproductive: Marked prostate enlargement noted.

Other: No free fluid, focal collection or pneumoperitoneum.

Musculoskeletal: No acute or significant osseous findings.
IMPRESSION: Marked bladder distention with mild to moderate bilateral
hydronephrosis. This is compatible with bladder outlet obstruction
of which may be caused by marked prostate enlargement.

No other acute or significant abnormality.

## 2018-01-26 IMAGING — US US EXTREM LOW VENOUS BILAT
1 series · 13 of 24 positions shown · non-contrast
Comparison: None.

CLINICAL DATA: 75-year-old male with new onset of leg swelling



[Series 1: us extrem low venous bilat · 0.07mm/px · 13 of 60 slices shown]
[im 1/60]
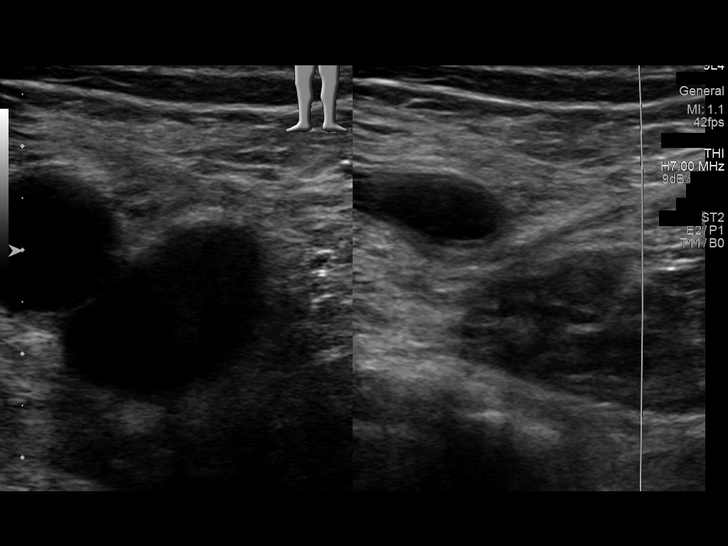
[im 6/60]
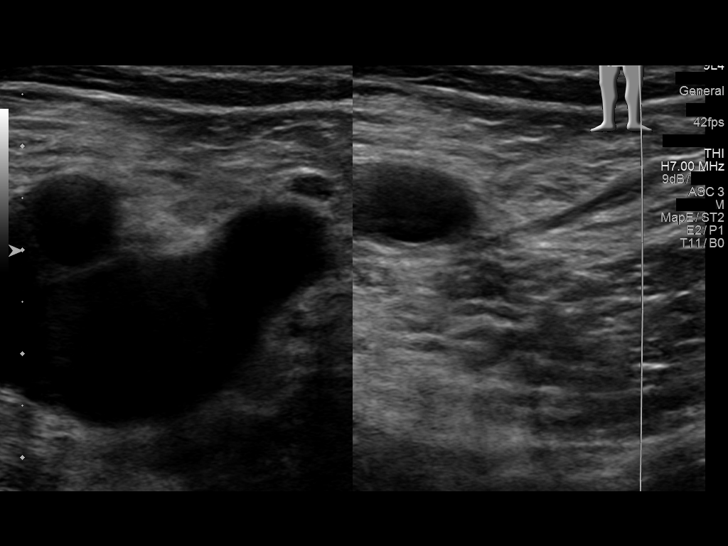
[im 11/60]
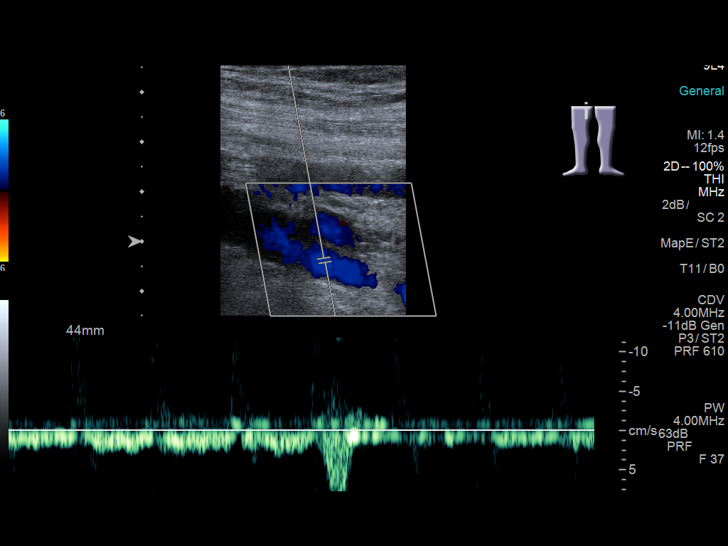
[im 16/60]
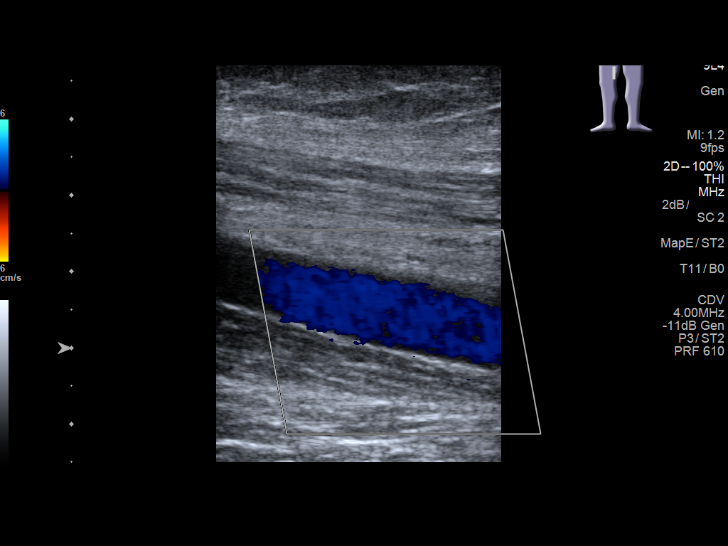
[im 21/60]
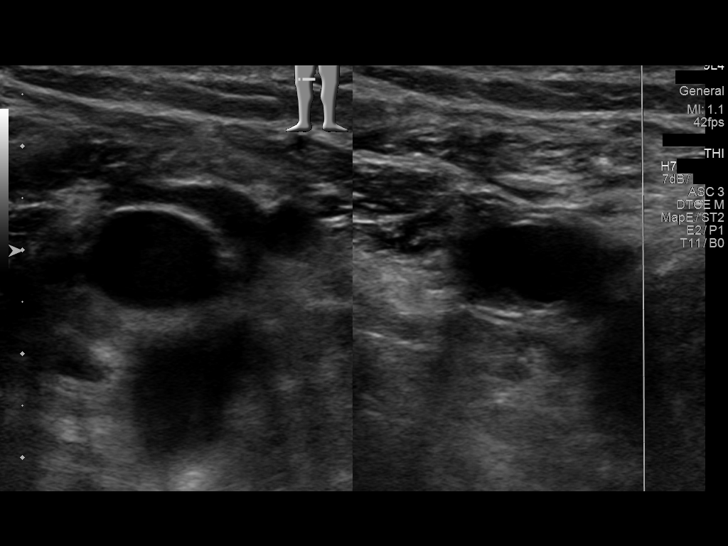
[im 26/60]
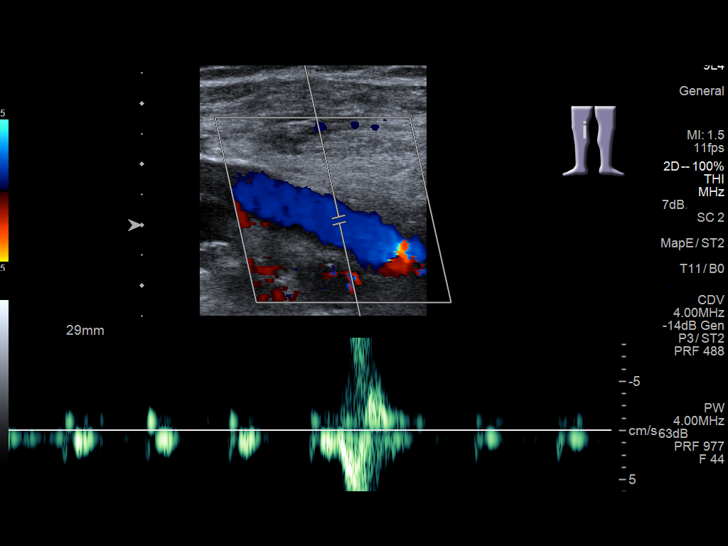
[im 31/60]
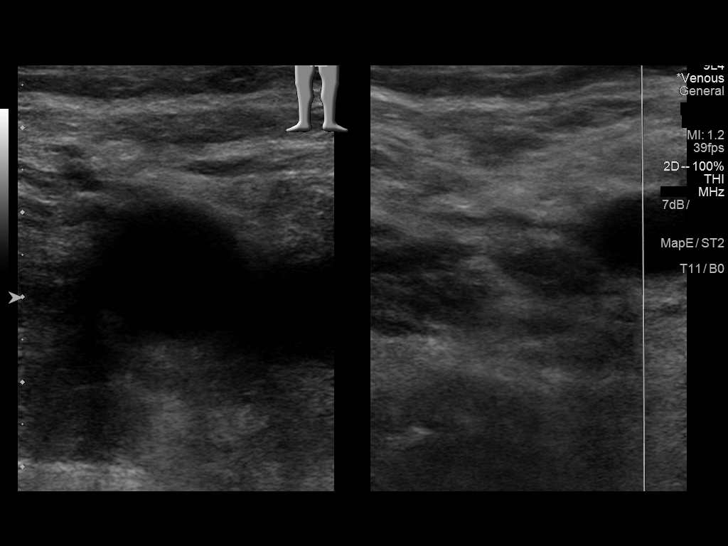
[im 34/60]
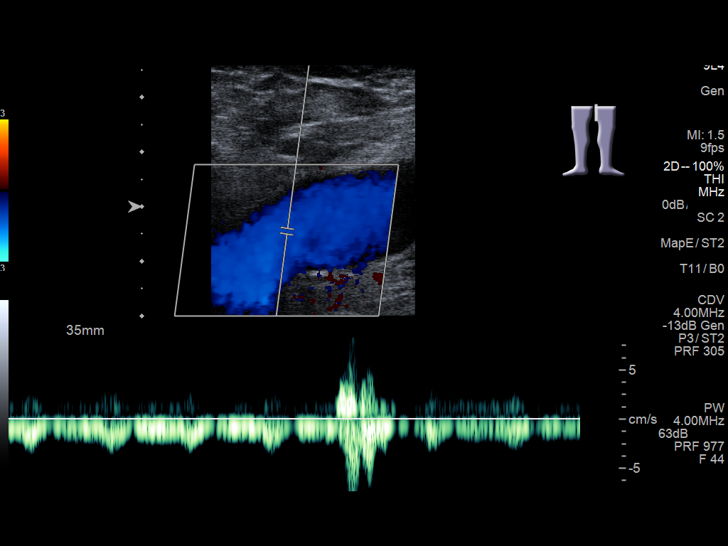
[im 39/60]
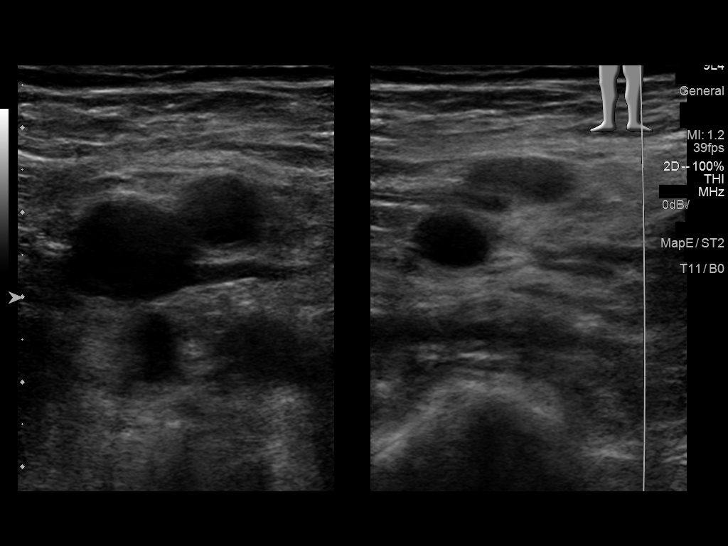
[im 44/60]
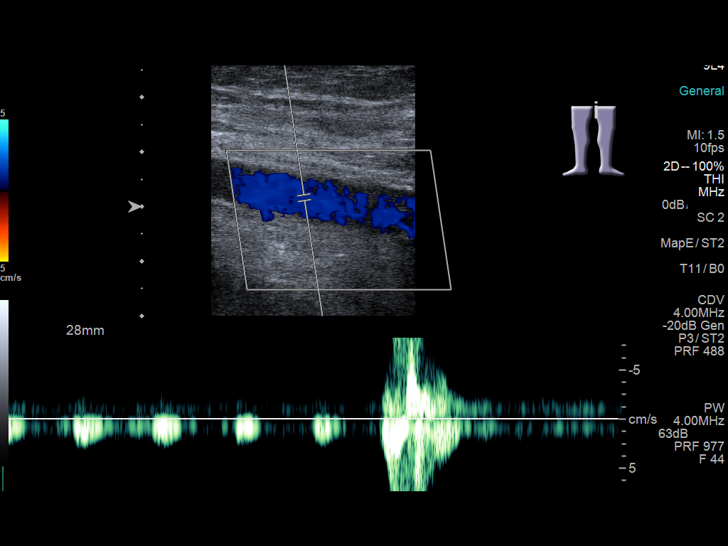
[im 49/60]
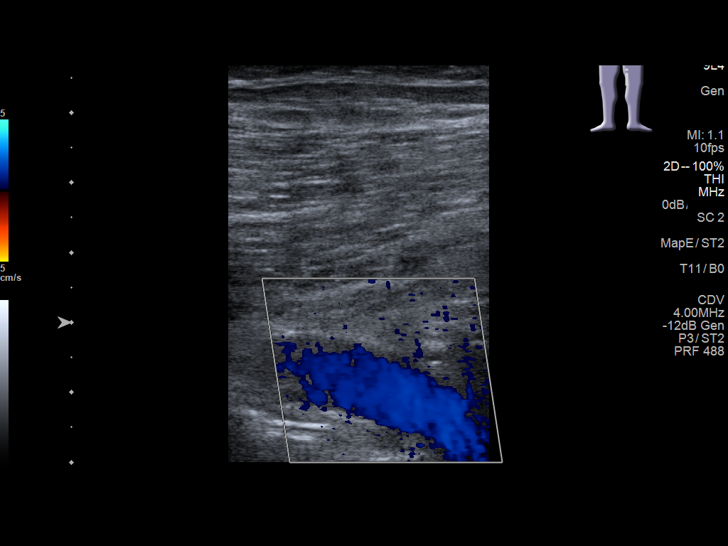
[im 54/60]
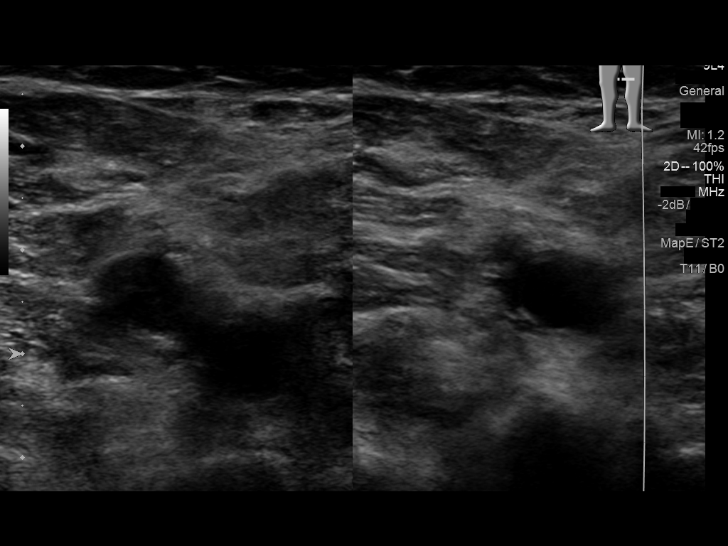
[im 60/60]
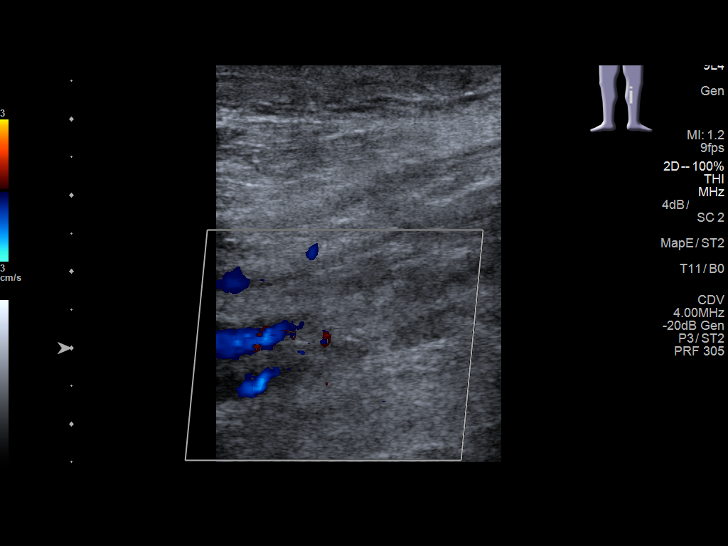

[13 of 24 positions shown; findings below may reference images not displayed]

FINDINGS: RIGHT LOWER EXTREMITY

Common Femoral Vein: No evidence of thrombus. Normal
compressibility, respiratory phasicity and response to augmentation.

Saphenofemoral Junction: No evidence of thrombus. Normal
compressibility and flow on color Doppler imaging.

Profunda Femoral Vein: No evidence of thrombus. Normal
compressibility and flow on color Doppler imaging.

Femoral Vein: No evidence of thrombus. Normal compressibility,
respiratory phasicity and response to augmentation.

Popliteal Vein: No evidence of thrombus. Normal compressibility,
respiratory phasicity and response to augmentation.

Calf Veins: No evidence of thrombus. Normal compressibility and flow
on color Doppler imaging.

Superficial Great Saphenous Vein: No evidence of thrombus. Normal
compressibility and flow on color Doppler imaging.

Other Findings:  None.

LEFT LOWER EXTREMITY

Common Femoral Vein: No evidence of thrombus. Normal
compressibility, respiratory phasicity and response to augmentation.

Saphenofemoral Junction: No evidence of thrombus. Normal
compressibility and flow on color Doppler imaging.

Profunda Femoral Vein: No evidence of thrombus. Normal
compressibility and flow on color Doppler imaging.

Femoral Vein: No evidence of thrombus. Normal compressibility,
respiratory phasicity and response to augmentation.

Popliteal Vein: No evidence of thrombus. Normal compressibility,
respiratory phasicity and response to augmentation.

Calf Veins: No evidence of thrombus. Normal compressibility and flow
on color Doppler imaging.

Superficial Great Saphenous Vein: No evidence of thrombus. Normal
compressibility and flow on color Doppler imaging.

Other Findings:  None.
IMPRESSION: Sonographic survey of the bilateral lower extremities negative for
DVT.

## 2018-02-06 ENCOUNTER — Encounter: Payer: Self-pay | Admitting: Podiatry

## 2018-02-06 ENCOUNTER — Ambulatory Visit (INDEPENDENT_AMBULATORY_CARE_PROVIDER_SITE_OTHER): Payer: Medicare Other | Admitting: Podiatry

## 2018-02-06 VITALS — BP 145/88 | HR 68

## 2018-02-06 DIAGNOSIS — L600 Ingrowing nail: Secondary | ICD-10-CM | POA: Diagnosis not present

## 2018-02-06 DIAGNOSIS — M21619 Bunion of unspecified foot: Secondary | ICD-10-CM | POA: Diagnosis not present

## 2018-02-06 DIAGNOSIS — B351 Tinea unguium: Secondary | ICD-10-CM

## 2018-02-06 MED ORDER — NEOMYCIN-POLYMYXIN-HC 3.5-10000-1 OT SOLN: OTIC | 0 refills | Status: DC

## 2018-02-06 NOTE — Progress Notes (Signed)
Subjective:   Patient ID: Glen Spencer, male   DOB: 77 y.o.   MRN: [id number]   HPI Patient presents with chronic ingrown toenail left big toe that he is tried to trim himself and soak.  Patient has not been seen in a number of years and had previous bunion surgery which did well and states overall health doing well and patient does not smoke and likes to be active   Review of Systems  All other systems reviewed and are negative.       Objective:  Physical Exam  Constitutional: He appears well-developed and well-nourished.  Cardiovascular: Intact distal pulses.  Pulmonary/Chest: Effort normal.  Musculoskeletal: Normal range of motion.  Neurological: He is alert.  Skin: Skin is warm.  Nursing note and vitals reviewed.   Neurovascular status intact strength is adequate range of motion within normal limits with patient noted to have incurvated left hallux medial border that is painful when pressed and make shoe gear difficult.  Patient has no active drainage or redness associated with it and it does appear to be strictly related to the structure of the nailbed itself.  Good digital perfusion well oriented x3     Assessment:  Ingrown toenail deformity left hallux medial border     Plan:  H&P condition reviewed and recommended removal of the border explaining procedure to patient.  I explained risk and patient signed consent form today infiltrated the left hallux 60 mg like Marcaine mixture remove the medial border exposed matrix and applied phenol 3 applications 30 seconds followed by alcohol lavage sterile dressing all this done with sterile technique and sterile prep to the area.  I then went ahead and I instructed on taking the dressing off tomorrow but to take it off today if it should start to become too tight or throbbing and patient will be seen back to reevaluate and is encouraged to call with questions

## 2018-02-06 NOTE — Patient Instructions (Signed)

## 2018-03-04 ENCOUNTER — Other Ambulatory Visit: Payer: Self-pay

## 2018-03-05 ENCOUNTER — Telehealth: Payer: Self-pay | Admitting: Urology

## 2018-03-05 NOTE — Telephone Encounter (Signed)
Received fax from PCP, PSA up to 3.2.  This is likely due to the fact that he is stopped his finasteride.  He will follow-up in my office as scheduled next year at which time we will consider repeating his PSA.  [name], MD

## 2018-04-11 ENCOUNTER — Other Ambulatory Visit: Payer: Self-pay | Admitting: Family Medicine

## 2018-04-11 DIAGNOSIS — R51 Headache: Principal | ICD-10-CM

## 2018-04-11 DIAGNOSIS — R519 Headache, unspecified: Secondary | ICD-10-CM

## 2018-04-17 ENCOUNTER — Ambulatory Visit: Payer: Medicare Other

## 2018-04-18 ENCOUNTER — Ambulatory Visit
Admission: RE | Admit: 2018-04-18 | Discharge: 2018-04-18 | Disposition: A | Discharge status: Home / Self Care | Payer: Medicare Other | Source: Ambulatory Visit | Attending: Family Medicine | Admitting: Family Medicine

## 2018-04-18 DIAGNOSIS — R51 Headache: Secondary | ICD-10-CM | POA: Insufficient documentation

## 2018-04-18 DIAGNOSIS — R519 Headache, unspecified: Secondary | ICD-10-CM

## 2018-06-19 NOTE — Progress Notes (Addendum)
06/19/2018 11:23 AM   Glen Spencer [birth year] [id number]  Referring provider: [name], MD [name] [place], [place] [address]  Chief Complaint  Patient presents with  . Benign Prostatic Hypertrophy    1year    HPI: Glen Spencer is a 78 yo M with massive BPH with history of urinary retention (preop178 cc prostate) s/p HoLEP on 03/26/16 (76 g resected) with rUTIs posoperatively found to have residual calcified prostate specimen within the bladder. He returned to the operating room on 11/19/2016 for removal of this calcified tissue.  He discontinued finasteride.   Today, he is very pleased with his urinary symptoms. No further dysuria or UTIs since last visit.  No hematuria. No urgency or frequency.He is extremely please with this results.    He reports of his progress from not being able to go on a plane due to worries of frequent urination to not worrying about any urinary symptoms at all.   He reports of not having a lasting erection and has tried [place] years ago which was effective.   He is interested in having another prescription for this.  No previous side effects.  No contraindications.  Most recent PSA 3.24 from 01/24/18. PSA trend below.   PSA Trend 2.23 07/07/2015 1.27 01/28/2017  --> stopped finasteride 3.24 01/24/2018   PMH: Past Medical History:  Diagnosis Date  . Anemia   . Asthma    childhood asthma  . Enlarged prostate with urinary retention   . GERD (gastroesophageal reflux disease)   . Hypertension     Surgical History: Past Surgical History:  Procedure Laterality Date  . COLONOSCOPY WITH PROPOFOL N/A 08/08/2015   Procedure: COLONOSCOPY WITH PROPOFOL;  Surgeon: Wallace Cullens, MD;  Location: [facility] ENDOSCOPY;  Service: Gastroenterology;  Laterality: N/A;  . CYSTOSCOPY W/ RETROGRADES Bilateral 11/19/2016   Procedure: CYSTOSCOPY WITH RETROGRADE PYELOGRAM;  Surgeon: [name], MD;  Location: [facility] ORS;  Service: Urology;  Laterality:  Bilateral;  . CYSTOSCOPY WITH LITHOLAPAXY N/A 11/19/2016   Procedure: CYSTOSCOPY WITH LITHOLAPAXY;  Surgeon: [name], MD;  Location: [facility] ORS;  Service: Urology;  Laterality: N/A;  . ESOPHAGOGASTRODUODENOSCOPY (EGD) WITH PROPOFOL N/A 08/08/2015   Procedure: ESOPHAGOGASTRODUODENOSCOPY (EGD) WITH PROPOFOL;  Surgeon: Wallace Cullens, MD;  Location: [facility] ENDOSCOPY;  Service: Gastroenterology;  Laterality: N/A;  . HERNIA REPAIR Right 1962   Inguinal Hernia  . HOLEP-LASER ENUCLEATION OF THE PROSTATE WITH MORCELLATION N/A 03/26/2016   Procedure: HOLEP-LASER ENUCLEATION OF THE PROSTATE WITH MORCELLATION;  Surgeon: [name], MD;  Location: [facility] ORS;  Service: Urology;  Laterality: N/A;  . HOLMIUM LASER APPLICATION N/A 03/26/2016   Procedure: HOLMIUM LASER APPLICATION;  Surgeon: [name], MD;  Location: [facility] ORS;  Service: Urology;  Laterality: N/A;  . HOLMIUM LASER APPLICATION N/A 11/19/2016   Procedure: HOLMIUM LASER APPLICATION;  Surgeon: [name], MD;  Location: [facility] ORS;  Service: Urology;  Laterality: N/A;    Home Medications:  Allergies as of 06/25/2018   No Known Allergies     Medication List       Accurate as of June 25, 2018 11:23 AM. Always use your most recent med list.        ferrous sulfate 325 (65 FE) MG tablet Take 325 mg by mouth daily with breakfast.   pantoprazole 40 MG tablet Commonly known as:  PROTONIX Take 40 mg by mouth 2 (two) times daily. Take 30 to 45 minutes prior to first meal of the day   potassium chloride 20  MEQ packet Commonly known as:  KLOR-CON Take 20 mEq by mouth daily.   sildenafil 20 MG tablet Commonly known as:  REVATIO Take 1 tablet (20 mg total) by mouth as needed. Take 1-5 tabs as needed prior to intercourse   valsartan-hydrochlorothiazide 160-25 MG tablet Commonly known as:  DIOVAN-HCT Take 1 tablet by mouth daily.       Allergies: No Known Allergies  Family History: Family History  Problem Relation Age of  Onset  . Prostate cancer Neg Hx   . Bladder Cancer Neg Hx   . Kidney cancer Neg Hx     Social History:  reports that he has never smoked. He has never used smokeless tobacco. He reports that he does not drink alcohol or use drugs.  ROS: UROLOGY Frequent Urination?: No Hard to postpone urination?: No Burning/pain with urination?: No Get up at night to urinate?: No Leakage of urine?: No Urine stream starts and stops?: No Trouble starting stream?: No Do you have to strain to urinate?: No Blood in urine?: No Urinary tract infection?: No Sexually transmitted disease?: No Injury to kidneys or bladder?: No Painful intercourse?: No Weak stream?: No Erection problems?: No Penile pain?: No  Gastrointestinal Nausea?: No Vomiting?: No Indigestion/heartburn?: No Diarrhea?: No Constipation?: No  Constitutional Fever: No Night sweats?: No Weight loss?: No Fatigue?: No  Skin Skin rash/lesions?: No Itching?: No  Eyes Blurred vision?: No Double vision?: No  Ears/Nose/Throat Sore throat?: No Sinus problems?: No  Hematologic/Lymphatic Swollen glands?: No Easy bruising?: No  Cardiovascular Leg swelling?: No Chest pain?: No  Respiratory Cough?: No Shortness of breath?: No  Endocrine Excessive thirst?: No  Musculoskeletal Back pain?: No Joint pain?: No  Neurological Headaches?: No Dizziness?: No  Psychologic Depression?: No Anxiety?: No  Physical Exam: BP (!) 161/81   Pulse 66   Ht 5\' 10"  (1.778 m)   Wt 230 lb (104.3 kg)   BMI 33.00 kg/m   Constitutional:  Alert and oriented, No acute distress. HEENT: [place] AT, moist mucus membranes.  Trachea midline, no masses. Cardiovascular: No clubbing, cyanosis, or edema. Respiratory: Normal respiratory effort, no increased work of breathing. Skin: No rashes, bruises or suspicious lesions. Neurologic: Grossly intact, no focal deficits, moving all 4 extremities. Psychiatric: Normal mood and affect.  Assessment &  Plan:    1. Urinary retention/ incomplete bladder emptying S/p HoLEP 11/17 now voiding independently Stopped finasteride, no bothersome urinary symptoms   2. Rising PSA PSA has doubled after stopping finasteride which is anticipated Plan repeat PSA again in 6 moths, if remains stable, he may be released from further urologic care if his symptoms are well controlled  3. Erectile dysfunction  Rx of sildenafil 20 mg given to be taken by mouth; Recommended to take 1 tablet first and if not effective increase dosage by 1 tablet until max of 5; Take on an empty stomach and 1 hour before intercourse  Return in about 6 months (around 12/24/2018) for PSA prior .  [facility] [address] [place], [place] [address] [phone]  I, Donne Hazel, am acting as a scribe for Dr. [name],  I have reviewed the above documentation for accuracy and completeness, and I agree with the above.   [name], MD

## 2018-06-25 ENCOUNTER — Other Ambulatory Visit: Payer: Self-pay | Admitting: Urology

## 2018-06-25 ENCOUNTER — Ambulatory Visit (INDEPENDENT_AMBULATORY_CARE_PROVIDER_SITE_OTHER): Payer: Medicare Other | Admitting: Urology

## 2018-06-25 ENCOUNTER — Encounter: Payer: Self-pay | Admitting: Urology

## 2018-06-25 ENCOUNTER — Other Ambulatory Visit: Payer: Self-pay

## 2018-06-25 VITALS — BP 161/81 | HR 66 | Ht 70.0 in | Wt 230.0 lb

## 2018-06-25 DIAGNOSIS — R972 Elevated prostate specific antigen [PSA]: Secondary | ICD-10-CM

## 2018-06-25 DIAGNOSIS — N400 Enlarged prostate without lower urinary tract symptoms: Secondary | ICD-10-CM | POA: Diagnosis not present

## 2018-06-25 DIAGNOSIS — N5203 Combined arterial insufficiency and corporo-venous occlusive erectile dysfunction: Secondary | ICD-10-CM

## 2018-06-25 MED ORDER — SILDENAFIL CITRATE 20 MG PO TABS: 20.0000 mg | ORAL_TABLET | ORAL | 11 refills | Status: DC | PRN

## 2018-07-10 ENCOUNTER — Other Ambulatory Visit: Payer: Self-pay

## 2018-07-10 ENCOUNTER — Encounter: Payer: Self-pay | Admitting: Podiatry

## 2018-07-10 ENCOUNTER — Ambulatory Visit (INDEPENDENT_AMBULATORY_CARE_PROVIDER_SITE_OTHER): Payer: Medicare Other | Admitting: Podiatry

## 2018-07-10 DIAGNOSIS — L03031 Cellulitis of right toe: Secondary | ICD-10-CM

## 2018-07-10 MED ORDER — CEPHALEXIN 500 MG PO CAPS: 500.0000 mg | ORAL_CAPSULE | Freq: Three times a day (TID) | ORAL | 1 refills | Status: DC

## 2018-07-10 NOTE — Progress Notes (Signed)
Subjective:   Patient ID: Glen Spencer, male   DOB: 78 y.o.   MRN: [id number]   HPI Patient presents stating that I am getting a lot of drainage from his left hallux nail over the last couple weeks and I am not sure what may have happened.  I had the surgery around 6 months ago   ROS      Objective:  Physical Exam  Neurovascular status intact with inflammation of the medial border of the left hallux localized with no proximal edema erythema drainage with crusted tissue formation that occurred with a recent duration of this problem     Assessment:  Paronychia infection of the left hallux medial border which may have just occurred and may be due to trauma or other pathology     Plan:  H&P condition reviewed and today went ahead and anesthetized the left hallux 60 mg like Marcaine mixture and using sterile instrumentation I removed the medial border of it all proud flesh abscess tissue allow channel for drainage flush the area out and then placed on and sterile dressing and gave instructions for soaks.  I then went ahead and I utilize cephalexin 500 mg 3 times daily for 10 days and instructed to let me know any redness drainage swelling or other pathology were to occur immediately

## 2018-11-28 ENCOUNTER — Other Ambulatory Visit: Payer: Self-pay | Admitting: Family Medicine

## 2018-11-28 DIAGNOSIS — Z20822 Contact with and (suspected) exposure to covid-19: Secondary | ICD-10-CM

## 2018-11-30 LAB — NOVEL CORONAVIRUS, NAA: SARS-CoV-2, NAA: NOT DETECTED

## 2018-12-24 ENCOUNTER — Other Ambulatory Visit: Payer: Medicare Other

## 2018-12-31 ENCOUNTER — Other Ambulatory Visit: Payer: Self-pay

## 2018-12-31 ENCOUNTER — Ambulatory Visit (INDEPENDENT_AMBULATORY_CARE_PROVIDER_SITE_OTHER): Payer: Medicare Other | Admitting: Urology

## 2018-12-31 ENCOUNTER — Encounter: Payer: Self-pay | Admitting: Urology

## 2018-12-31 VITALS — BP 142/68 | HR 67 | Ht 70.0 in | Wt 225.0 lb

## 2018-12-31 DIAGNOSIS — N5203 Combined arterial insufficiency and corporo-venous occlusive erectile dysfunction: Secondary | ICD-10-CM

## 2018-12-31 DIAGNOSIS — N400 Enlarged prostate without lower urinary tract symptoms: Secondary | ICD-10-CM | POA: Diagnosis not present

## 2018-12-31 DIAGNOSIS — R972 Elevated prostate specific antigen [PSA]: Secondary | ICD-10-CM | POA: Diagnosis not present

## 2018-12-31 NOTE — Progress Notes (Signed)
12/31/2018 10:30 AM   Glen Griffithsaymond E Berry 1940/05/13 [id number]  Referring provider: Dorothey BasemanBronstein, David, MD [phone] [place],  [place] [id number]  Chief Complaint  Patient presents with  . Benign Prostatic Hypertrophy    HPI: Glen Spencer is a 78 yo M with massive BPH with history of urinary retention (preop178 cc prostate) s/p HoLEP on 03/26/16 (76 g resected) with rUTIs posoperatively found to have residual calcified prostate specimen within the bladder. He returned to the operating room on 11/19/2016 for removal of this calcified tissue.  He is no longer a BPH meds.  Since the calcified tissue was removed, he is had no further UTIs.  Urinalysis performed by PCP last month was clear.  He does have a also a history of erectile dysfunction.  He was prescribed generic sildenafil last visit.  He reports that his wife is been unwell and he is not tried this medication.  He is looking forward to using it when she recovers.  He continues to be thrilled with his urinary symptoms.  No urgency or frequency.  He feels like he is emptying his bladder.  Occasional drops of leakage with strenuous activity but this is rare.  He is not bothered by this.  No dysuria or gross hematuria.  No UTIs.  PSA Trend 2.23 07/07/2015 1.27 01/28/2017  --> stopped finasteride 1.5  06/24/2017 3.24 01/24/2018  3.71 12/08/2018   PMH: Past Medical History:  Diagnosis Date  . Anemia   . Asthma    childhood asthma  . Enlarged prostate with urinary retention   . GERD (gastroesophageal reflux disease)   . Hypertension     Surgical History: Past Surgical History:  Procedure Laterality Date  . COLONOSCOPY WITH PROPOFOL N/A 08/08/2015   Procedure: COLONOSCOPY WITH PROPOFOL;  Surgeon: Wallace CullensPaul Y Oh, MD;  [facility]COPY;  Service: Gastroenterology;  Laterality: N/A;  . CYSTOSCOPY W/ RETROGRADES Bilateral 11/19/2016   Procedure: CYSTOSCOPY WITH RETROGRADE PYELOGRAM;  Surgeon: Vanna ScotlandBrandon, Laurent Cargile, MD;   Location: [facility] ORS;  Service: Urology;  Laterality: Bilateral;  . CYSTOSCOPY WITH LITHOLAPAXY N/A 11/19/2016   Procedure: CYSTOSCOPY WITH LITHOLAPAXY;  Surgeon: Vanna ScotlandBrandon, Rashidah [place]ville, MD;  Location: [facility] ORS;  Service: Urology;  Laterality: N/A;  . ESOPHAGOGASTRODUODENOSCOPY (EGD) WITH PROPOFOL N/A 08/08/2015   Procedure: ESOPHAGOGASTRODUODENOSCOPY (EGD) WITH PROPOFOL;  Surgeon: Wallace CullensPaul Y Oh, MD;  [facility];  Service: Gastroenterology;  Laterality: N/A;  . HERNIA REPAIR Right 1962   Inguinal Hernia  . HOLEP-LASER ENUCLEATION OF THE PROSTATE WITH MORCELLATION N/A 03/26/2016   Procedure: HOLEP-LASER ENUCLEATION OF THE PROSTATE WITH MORCELLATION;  Surgeon: Vanna ScotlandAshley Jayron Maqueda, MD;  Location: [facility] ORS;  Service: Urology;  Laterality: N/A;  . HOLMIUM LASER APPLICATION N/A 03/26/2016   Procedure: HOLMIUM LASER APPLICATION;  Surgeon: Vanna ScotlandAshley Haelee Bolen, MD;  Location: [facility] ORS;  Service: Urology;  Laterality: N/A;  . HOLMIUM LASER APPLICATION N/A 11/19/2016   Procedure: HOLMIUM LASER APPLICATION;  Surgeon: Vanna ScotlandBrandon, Alayziah Tangeman, MD;  Location: [facility] ORS;  Service: Urology;  Laterality: N/A;    Home Medications:  Allergies as of 12/31/2018   No Known Allergies     Medication List       Accurate as of December 31, 2018 10:30 AM. If you have any questions, ask your nurse or doctor.        STOP taking these medications   cephALEXin 500 MG capsule Commonly known as: KEFLEX Stopped by: Vanna ScotlandAshley Rolonda Pontarelli, MD     TAKE these medications   ferrous sulfate 325 (65 FE) MG tablet  Take 325 mg by mouth daily with breakfast.   fluticasone 50 MCG/ACT nasal spray Commonly known as: FLONASE SHAKE LIQUID AND USE 2 SPRAYS IN EACH NOSTRIL ONCE DAILY   pantoprazole 40 MG tablet Commonly known as: PROTONIX Take 40 mg by mouth 2 (two) times daily. Take 30 to 45 minutes prior to first meal of the day   potassium chloride 20 MEQ packet Commonly known as: KLOR-CON Take 20 mEq by mouth daily.   sildenafil 20 MG tablet  Commonly known as: Revatio Take 1 tablet (20 mg total) by mouth as needed. Take 1-5 tabs as needed prior to intercourse   valsartan-hydrochlorothiazide 160-25 MG tablet Commonly known as: DIOVAN-HCT Take 1 tablet by mouth daily.       Allergies: No Known Allergies  Family History: Family History  Problem Relation Age of Onset  . Prostate cancer Neg Hx   . Bladder Cancer Neg Hx   . Kidney cancer Neg Hx     Social History:  reports that he has never smoked. He has never used smokeless tobacco. He reports that he does not drink alcohol or use drugs.  ROS: UROLOGY Frequent Urination?: No Hard to postpone urination?: No Burning/pain with urination?: No Get up at night to urinate?: No Leakage of urine?: No Urine stream starts and stops?: No Trouble starting stream?: No Do you have to strain to urinate?: No Blood in urine?: No Urinary tract infection?: No Sexually transmitted disease?: No Injury to kidneys or bladder?: No Painful intercourse?: No Weak stream?: No Erection problems?: No Penile pain?: No  Gastrointestinal Nausea?: No Vomiting?: No Indigestion/heartburn?: No Diarrhea?: No Constipation?: No  Constitutional Fever: No Night sweats?: No Weight loss?: No Fatigue?: No  Skin Skin rash/lesions?: No Itching?: No  Eyes Blurred vision?: No Double vision?: No  Ears/Nose/Throat Sore throat?: No Sinus problems?: No  Hematologic/Lymphatic Swollen glands?: No Easy bruising?: No  Cardiovascular Leg swelling?: No Chest pain?: No  Respiratory Cough?: No Shortness of breath?: No  Endocrine Excessive thirst?: No  Musculoskeletal Back pain?: No Joint pain?: No  Neurological Headaches?: No Dizziness?: No  Psychologic Depression?: No Anxiety?: No  Physical Exam: BP (!) 142/68   Pulse 67   Ht 5\' 10"  (1.778 m)   Wt 225 lb (102.1 kg)   BMI 32.28 kg/m   Constitutional:  Alert and oriented, No acute distress. HEENT: [place] AT, moist mucus  membranes.  Trachea midline, no masses. Cardiovascular: No clubbing, cyanosis, or edema. Respiratory: Normal respiratory effort, no increased work of breathing. Skin: No rashes, bruises or suspicious lesions. Neurologic: Grossly intact, no focal deficits, moving all 4 extremities. Psychiatric: Normal mood and affect.  Laboratory Data: Lab Results  Component Value Date   WBC 5.4 11/09/2016   HGB 13.4 11/09/2016   HCT 40.5 11/09/2016   MCV 83.0 11/09/2016   PLT 121 (L) 11/09/2016    Lab Results  Component Value Date   CREATININE 1.36 (H) 03/26/2016    Urinalysis UA from PCP from 11/2018 personally reviewed, negative  Assessment & Plan:    1. Benign prostatic hyperplasia without lower urinary tract symptoms Excellent urinary control status post holep  2. Combined arterial insufficiency and corporo-venous occlusive erectile dysfunction Sildenafil as needed  3. Rising PSA level Rising PSA secondary to cessation of finasteride His PSA is stabilized at this point I did recommend 1 additional PSA value by his primary care next next year to ensure that it does not continue to rise, refer back as needed   fU prn  Hollice Espy, MD  [facility] [address], [place], [place] [address] [phone]

## 2019-04-02 ENCOUNTER — Other Ambulatory Visit: Payer: Self-pay

## 2019-04-02 DIAGNOSIS — Z20822 Contact with and (suspected) exposure to covid-19: Secondary | ICD-10-CM

## 2019-04-05 LAB — NOVEL CORONAVIRUS, NAA: SARS-CoV-2, NAA: NOT DETECTED

## 2019-05-15 ENCOUNTER — Ambulatory Visit: Payer: Medicare Other | Attending: Internal Medicine

## 2019-05-15 DIAGNOSIS — Z23 Encounter for immunization: Secondary | ICD-10-CM | POA: Insufficient documentation

## 2019-05-15 NOTE — Progress Notes (Signed)
   Covid-19 Vaccination Clinic  Name:  Glen Spencer    MRN: [id number] DOB: 05-10-1940  05/15/2019  Glen Spencer was observed post Covid-19 immunization for 15 minutes without incidence. He was provided with Vaccine Information Sheet and instruction to access the V-Safe system.   Glen Spencer was instructed to call 911 with any severe reactions post vaccine: [name] Kitchen Difficulty breathing  . Swelling of your face and throat  . A fast heartbeat  . A bad rash all over your body  . Dizziness and weakness    Immunizations Administered    Name Date Dose VIS Date Route   Pfizer COVID-19 Vaccine 05/15/2019  9:09 AM 0.3 mL 04/10/2019 Intramuscular   Manufacturer: [employer], [employer]   Lot: [id number]   NDC: [address]-1

## 2019-06-02 ENCOUNTER — Ambulatory Visit: Payer: Medicare Other | Attending: Internal Medicine

## 2019-06-02 DIAGNOSIS — Z23 Encounter for immunization: Secondary | ICD-10-CM | POA: Insufficient documentation

## 2019-06-02 NOTE — Progress Notes (Signed)
   Covid-19 Vaccination Clinic  Name:  Glen Spencer    MRN: [id number] DOB: 12-14-1940  06/02/2019  Glen Spencer was observed post Covid-19 immunization for 15 minutes without incidence. He was provided with Vaccine Information Sheet and instruction to access the V-Safe system.   Glen Spencer was instructed to call 911 with any severe reactions post vaccine: [name] Kitchen Difficulty breathing  . Swelling of your face and throat  . A fast heartbeat  . A bad rash all over your body  . Dizziness and weakness    Immunizations Administered    Name Date Dose VIS Date Route   Pfizer COVID-19 Vaccine 06/02/2019  9:01 AM 0.3 mL 04/10/2019 Intramuscular   Manufacturer: [employer], [employer]   Lot: [id number]   NDC: [address]-8

## 2019-11-24 ENCOUNTER — Other Ambulatory Visit (HOSPITAL_COMMUNITY): Payer: Self-pay | Admitting: Gastroenterology

## 2019-11-24 ENCOUNTER — Other Ambulatory Visit: Payer: Self-pay | Admitting: Gastroenterology

## 2019-11-24 DIAGNOSIS — R1314 Dysphagia, pharyngoesophageal phase: Secondary | ICD-10-CM

## 2019-11-24 DIAGNOSIS — K2100 Gastro-esophageal reflux disease with esophagitis, without bleeding: Secondary | ICD-10-CM

## 2019-12-09 ENCOUNTER — Ambulatory Visit (HOSPITAL_COMMUNITY)
Admission: RE | Admit: 2019-12-09 | Discharge: 2019-12-09 | Disposition: A | Discharge status: Home / Self Care | Payer: Medicare Other | Source: Ambulatory Visit | Attending: Gastroenterology | Admitting: Gastroenterology

## 2019-12-09 ENCOUNTER — Other Ambulatory Visit: Payer: Self-pay

## 2019-12-09 DIAGNOSIS — R1314 Dysphagia, pharyngoesophageal phase: Secondary | ICD-10-CM | POA: Insufficient documentation

## 2019-12-09 DIAGNOSIS — K2100 Gastro-esophageal reflux disease with esophagitis, without bleeding: Secondary | ICD-10-CM | POA: Diagnosis present

## 2020-02-26 ENCOUNTER — Ambulatory Visit: Payer: Medicare Other

## 2021-02-20 ENCOUNTER — Other Ambulatory Visit (HOSPITAL_COMMUNITY): Payer: Self-pay | Admitting: Neurology

## 2021-02-20 ENCOUNTER — Other Ambulatory Visit: Payer: Self-pay | Admitting: Neurology

## 2021-02-20 DIAGNOSIS — R413 Other amnesia: Secondary | ICD-10-CM

## 2021-02-27 ENCOUNTER — Ambulatory Visit (HOSPITAL_COMMUNITY): Payer: Medicare Other

## 2021-03-10 ENCOUNTER — Other Ambulatory Visit: Payer: Self-pay

## 2021-03-10 ENCOUNTER — Ambulatory Visit (HOSPITAL_COMMUNITY)
Admission: RE | Admit: 2021-03-10 | Discharge: 2021-03-10 | Disposition: A | Discharge status: Home / Self Care | Payer: Medicare Other | Source: Ambulatory Visit | Attending: Neurology | Admitting: Neurology

## 2021-03-10 DIAGNOSIS — R413 Other amnesia: Secondary | ICD-10-CM | POA: Diagnosis not present

## 2023-01-05 ENCOUNTER — Emergency Department
Admission: EM | Admit: 2023-01-05 | Discharge: 2023-01-05 | Disposition: A | Discharge status: Home / Self Care | Payer: Medicare Other | Attending: Emergency Medicine | Admitting: Emergency Medicine

## 2023-01-05 ENCOUNTER — Other Ambulatory Visit: Payer: Self-pay

## 2023-01-05 ENCOUNTER — Emergency Department: Payer: Medicare Other

## 2023-01-05 DIAGNOSIS — E876 Hypokalemia: Secondary | ICD-10-CM | POA: Diagnosis not present

## 2023-01-05 DIAGNOSIS — E871 Hypo-osmolality and hyponatremia: Secondary | ICD-10-CM | POA: Insufficient documentation

## 2023-01-05 DIAGNOSIS — N2889 Other specified disorders of kidney and ureter: Secondary | ICD-10-CM | POA: Diagnosis not present

## 2023-01-05 DIAGNOSIS — I10 Essential (primary) hypertension: Secondary | ICD-10-CM | POA: Diagnosis not present

## 2023-01-05 DIAGNOSIS — E860 Dehydration: Secondary | ICD-10-CM | POA: Insufficient documentation

## 2023-01-05 DIAGNOSIS — R55 Syncope and collapse: Secondary | ICD-10-CM | POA: Insufficient documentation

## 2023-01-05 DIAGNOSIS — Z20822 Contact with and (suspected) exposure to covid-19: Secondary | ICD-10-CM | POA: Insufficient documentation

## 2023-01-05 LAB — CBC WITH DIFFERENTIAL/PLATELET
Abs Immature Granulocytes: 0.03 10*3/uL (ref 0.00–0.07)
Basophils Absolute: 0 10*3/uL (ref 0.0–0.1)
Basophils Relative: 0 %
Eosinophils Absolute: 0.1 10*3/uL (ref 0.0–0.5)
Eosinophils Relative: 1 %
HCT: 41.5 % (ref 39.0–52.0)
Hemoglobin: 14.6 g/dL (ref 13.0–17.0)
Immature Granulocytes: 0 %
Lymphocytes Relative: 12 %
Lymphs Abs: 1 10*3/uL (ref 0.7–4.0)
MCH: 31.3 pg (ref 26.0–34.0)
MCHC: 35.2 g/dL (ref 30.0–36.0)
MCV: 89.1 fL (ref 80.0–100.0)
Monocytes Absolute: 0.6 10*3/uL (ref 0.1–1.0)
Monocytes Relative: 8 %
Neutro Abs: 6.5 10*3/uL (ref 1.7–7.7)
Neutrophils Relative %: 79 %
Platelets: 120 10*3/uL — ABNORMAL LOW (ref 150–400)
RBC: 4.66 MIL/uL (ref 4.22–5.81)
RDW: 12 % (ref 11.5–15.5)
WBC: 8.3 10*3/uL (ref 4.0–10.5)
nRBC: 0 % (ref 0.0–0.2)

## 2023-01-05 LAB — COMPREHENSIVE METABOLIC PANEL
ALT: 15 U/L (ref 0–44)
AST: 21 U/L (ref 15–41)
Albumin: 3.4 g/dL — ABNORMAL LOW (ref 3.5–5.0)
Alkaline Phosphatase: 62 U/L (ref 38–126)
Anion gap: 6 (ref 5–15)
BUN: 12 mg/dL (ref 8–23)
CO2: 28 mmol/L (ref 22–32)
Calcium: 8.6 mg/dL — ABNORMAL LOW (ref 8.9–10.3)
Chloride: 98 mmol/L (ref 98–111)
Creatinine, Ser: 0.83 mg/dL (ref 0.61–1.24)
GFR, Estimated: >60 mL/min (ref >60)
Glucose, Bld: 110 mg/dL — ABNORMAL HIGH (ref 70–99)
Potassium: 3.4 mmol/L — ABNORMAL LOW (ref 3.5–5.1)
Sodium: 132 mmol/L — ABNORMAL LOW (ref 135–145)
Total Bilirubin: 0.6 mg/dL (ref 0.3–1.2)
Total Protein: 5.9 g/dL — ABNORMAL LOW (ref 6.5–8.1)

## 2023-01-05 LAB — URINALYSIS, ROUTINE W REFLEX MICROSCOPIC
Bilirubin Urine: NEGATIVE
Glucose, UA: NEGATIVE mg/dL
Hgb urine dipstick: NEGATIVE
Ketones, ur: NEGATIVE mg/dL
Leukocytes,Ua: NEGATIVE
Nitrite: NEGATIVE
Protein, ur: NEGATIVE mg/dL
Specific Gravity, Urine: 1.016 (ref 1.005–1.030)
pH: 7 (ref 5.0–8.0)

## 2023-01-05 LAB — SARS CORONAVIRUS 2 BY RT PCR: SARS Coronavirus 2 by RT PCR: NEGATIVE

## 2023-01-05 LAB — LIPASE, BLOOD: Lipase: 32 U/L (ref 11–51)

## 2023-01-05 LAB — TROPONIN I (HIGH SENSITIVITY): Troponin I (High Sensitivity): 6 ng/L (ref <18)

## 2023-01-05 MED ORDER — IOHEXOL 300 MG/ML  SOLN
100.0000 mL | Freq: Once | INTRAMUSCULAR | Status: AC | PRN
  Administered 2023-01-05: 100 mL via INTRAVENOUS

## 2023-01-05 MED ORDER — SODIUM CHLORIDE 0.9 % IV BOLUS
1000.0000 mL | Freq: Once | INTRAVENOUS | Status: AC
  Administered 2023-01-05: 1000 mL via INTRAVENOUS

## 2023-01-05 NOTE — ED Notes (Signed)
Patient ambulatory in bedroom in room. Pt provided urine sample.

## 2023-01-05 NOTE — ED Provider Notes (Signed)
[facility] Provider Note    Event Date/Time   First MD Initiated Contact with Patient 01/05/23 [id number]     (approximate)   History   Loss of Consciousness (htn) and Hypotension   HPI  Glen Spencer is a 82 y.o. male on review of primary care note from August has a history of hypertension and elevated PSA.  Also history of reflux   Yesterday patient was experiencing constipation, and he took laxatives for it.  He has a history of intermittent constipation of similar type.  After taking senna he started not long after having multiple very loose bowel movements and cramps.  While using the toilet he got lightheaded, and passed out.  He did not fall or become injured.  His wife was with him as he started feeling lightheaded and she reports that over the course of about 2 minutes he sort of went in and out of consciousness a few times.  He did not initially wish to come by EMS, but did ultimately present to the emergency department  He experiences no headache no chest pain no shortness of breath.  No leg swelling.  He had crampy abdominal discomfort which is typical of when he has relief of constipation which is now gone away.  He has not any recent illnesses or fever  He feels much better now.  Wife reports normally he hydrates well prior to taking laxatives, but in this case they forgot  Physical Exam   Triage Vital Signs: ED Triage Vitals  Encounter Vitals Group     BP 01/05/23 0624 (!) 149/80     Systolic BP Percentile --      Diastolic BP Percentile --      Pulse Rate 01/05/23 0624 74     Resp 01/05/23 0624 18     Temp 01/05/23 0624 (!) 97.5 F (36.4 C)     Temp Source 01/05/23 0624 Oral     SpO2 01/05/23 0624 99 %     Weight 01/05/23 0626 208 lb 15.9 oz (94.8 kg)     Height 01/05/23 0626 5\' 10"  (1.778 m)     Head Circumference --      Peak Flow --      Pain Score 01/05/23 0626 0     Pain Loc --      Pain Education --      Exclude from Growth  Chart --     Most recent vital signs: Vitals:   01/05/23 0645 01/05/23 0700  BP:  135/67  Pulse: 67 65  Resp: 13 15  Temp:    SpO2: 100% 98%     General: Awake, no distress.  Mucous membranes slightly dry CV:  Good peripheral perfusion.  Normal tones and rate Resp:  Normal effort.  Clear bilateral Abd:  No distention.  Soft nontender nondistended throughout Other:  No lower extremity venous cords or congestion   ED Results / Procedures / Treatments   Labs (all labs ordered are listed, but only abnormal results are displayed) Labs Reviewed  CBC WITH DIFFERENTIAL/PLATELET - Abnormal; Notable for the following components:      Result Value   Platelets 120 (*)    All other components within normal limits  COMPREHENSIVE METABOLIC PANEL - Abnormal; Notable for the following components:   Sodium 132 (*)    Potassium 3.4 (*)    Glucose, Bld 110 (*)    Calcium 8.6 (*)    Total Protein 5.9 (*)  Albumin 3.4 (*)    All other components within normal limits  URINALYSIS, ROUTINE W REFLEX MICROSCOPIC - Abnormal; Notable for the following components:   Color, Urine YELLOW (*)    APPearance CLEAR (*)    All other components within normal limits  SARS CORONAVIRUS 2 BY RT PCR  LIPASE, BLOOD  TROPONIN I (HIGH SENSITIVITY)  TROPONIN I (HIGH SENSITIVITY)   Initial troponin normal.  Metabolic panel notable for very mild hyponatremia and hypokalemia.  Appropriate creatinine.  Appropriate hemoglobin white count  EKG  And interpreted by me at 8:03 AM heart rate 70 QRS 90 QTc 420 Normal sinus rhythm no evidence of acute ischemia.  Slight baseline artifact     RADIOLOGY  CT ABDOMEN PELVIS W CONTRAST  Result Date: 01/05/2023 CLINICAL DATA:  82 year old male with history of acute onset of nonlocalized abdominal pain. Syncope and hypotension. EXAM: CT ABDOMEN AND PELVIS WITH CONTRAST TECHNIQUE: Multidetector CT imaging of the abdomen and pelvis was performed using the standard protocol  following bolus administration of intravenous contrast. RADIATION DOSE REDUCTION: This exam was performed according to the departmental dose-optimization program which includes automated exposure control, adjustment of the mA and/or kV according to patient size and/or use of iterative reconstruction technique. CONTRAST:  OMNIPAQUE IOHEXOL 300 MG/ML  SOLN COMPARISON:  CT of the abdomen and pelvis 02/12/2016. FINDINGS: Lower chest: Atherosclerotic calcifications in the descending thoracic aorta. Small hiatal hernia. Hepatobiliary: Subcentimeter low-attenuation lesion in segment 3 of the liver, too small to characterize, but statistically likely a tiny cyst or biliary hamartoma (no imaging follow-up recommended). No other aggressive appearing hepatic lesions are noted. No intra or extrahepatic biliary ductal dilatation. Gallbladder is unremarkable in appearance. Pancreas: No pancreatic mass. No pancreatic ductal dilatation. No pancreatic or peripancreatic fluid collections or inflammatory changes. Spleen: Unremarkable. Adrenals/Urinary Tract: 1.4 cm low-attenuation lesion in the lower pole of the right kidney is compatible with a simple (Bosniak class 1) cyst which requires no imaging follow-up. Subtle lesion in the lower pole of the right kidney (axial image 29 of series 7) measuring 1.4 x 1.3 cm, concerning for potential enhancing renal neoplasm. Left kidney and bilateral adrenal glands are otherwise normal in appearance. No hydroureteronephrosis. Urinary bladder is severely distended measuring up to 11.3 x 7.3 x 17.3 cm (estimated bladder volume of 714 mL). Several small bladder diverticuli and bladder trabeculations are noted. Stomach/Bowel: The appearance of the stomach is unremarkable. No pathologic dilatation of small bowel or colon. A few scattered colonic diverticuli are noted, without definite focal surrounding inflammatory changes to indicate an acute diverticulitis at this time. Normal appendix.  Vascular/Lymphatic: Atherosclerosis in the abdominal aorta and pelvic vasculature. No lymphadenopathy noted in the abdomen or pelvis. Reproductive: Severe prostatomegaly. Prostate measures approximately 8.0 x 7.4 x 8.3 cm and is markedly irregular in contour in appearance. Other: No significant volume of ascites.  No pneumoperitoneum. Musculoskeletal: There are no aggressive appearing lytic or blastic lesions noted in the visualized portions of the skeleton. IMPRESSION: 1. Severely distended urinary bladder. Marked prostatomegaly. Clinical correlation for signs and symptoms of bladder outlet obstruction is recommended. 2. Colonic diverticulosis without evidence of acute diverticulitis at this time. 3. 1.3 x 1.4 cm lesion in the lower pole of the right kidney concerning for potential neoplasm. Further evaluation with nonemergent outpatient abdominal MRI with and without IV gadolinium is recommended in the near future to provide more definitive characterization and exclude neoplasm. 4. Aortic atherosclerosis. 5. Small hiatal hernia. 6. Additional incidental findings, as above. Electronically  Signed   By: Trudie Reed M.D.   On: 01/05/2023 08:31   CT Head Wo Contrast  Result Date: 01/05/2023 CLINICAL DATA:  82 year old male with syncope and hypotension. EXAM: CT HEAD WITHOUT CONTRAST TECHNIQUE: Contiguous axial images were obtained from the base of the skull through the vertex without intravenous contrast. RADIATION DOSE REDUCTION: This exam was performed according to the departmental dose-optimization program which includes automated exposure control, adjustment of the mA and/or kV according to patient size and/or use of iterative reconstruction technique. COMPARISON:  Brain MRI 03/10/2021.  Head CT 04/18/2018. FINDINGS: Brain: Cerebral volume is not significantly changed. No midline shift, mass effect, or evidence of intracranial mass lesion. No ventriculomegaly. No acute intracranial hemorrhage identified.  Patchy and confluent bilateral white matter hypodensity is moderate for age. Deep white matter capsule involvement greater on the right appears stable. No cortically based acute infarct identified. Vascular: No suspicious intracranial vascular hyperdensity. Skull: No acute osseous abnormality identified. Sinuses/Orbits: Visualized paranasal sinuses and mastoids are stable and well aerated. Other: No acute orbit or scalp soft tissue finding. IMPRESSION: No acute intracranial abnormality. Stable non contrast CT appearance of chronic white matter disease. Electronically Signed   By: Odessa Fleming M.D.   On: 01/05/2023 08:16      PROCEDURES:  Critical Care performed: No  Procedures   MEDICATIONS ORDERED IN ED: Medications  sodium chloride 0.9 % bolus 1,000 mL (1,000 mLs Intravenous New Bag/Given 01/05/23 0656)  iohexol (OMNIPAQUE) 300 MG/ML solution 100 mL (100 mLs Intravenous Contrast Given 01/05/23 0801)     IMPRESSION / MDM / ASSESSMENT AND PLAN / ED COURSE  I reviewed the triage vital signs and the nursing notes.                              Differential diagnosis includes, but is not limited to, syncope likely or potentially due to vasovagal because of dehydration or combination of both, orthostatic, etc.  He is now fully awake alert well-oriented without distress or discomfort.  He reports full recovery and feeling improved.  He was hypotensive initially with EMS but his blood pressures responded well after receiving IV fluid.  In further history taking his wife reports that he has had similar episodes a few times in the past around times of constipation, and that she estimates the last time he passed out was about a year and a half ago.  He has no symptoms to suggest acute central neurologic cause.  Preceding symptoms of chest pain or palpitations.  He is alert well-oriented to neurologic normal now.  Mucous membranes slightly dry but has been able to demonstrate IV and oral hydration here, and  after receiving IV fluids blood pressure has normalized and he has been able to walk back and forth to urinal without difficulty and feels much better.  Patient's presentation is most consistent with acute complicated illness / injury requiring diagnostic workup.   The patient is on the cardiac monitor to evaluate for evidence of arrhythmia and/or significant heart rate changes.  ----------------------------------------- 9:24 AM on 01/05/2023 ----------------------------------------- Patient alert fully oriented.  Feels well.  No ongoing symptoms.  Appears appropriate for discharge suspect dehydration in combination with vasovagal etiology at this time.  Return precautions and treatment recommendations and follow-up discussed with the patient who is agreeable with the plan.      FINAL CLINICAL IMPRESSION(S) / ED DIAGNOSES   Final diagnoses:  Dehydration  Syncope and collapse  Right renal mass     Rx / DC Orders   ED Discharge Orders          Ordered    Ambulatory referral to Urology       Comments: Renal mass. 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   Sharyn Creamer, MD 01/05/23 [phone]

## 2023-01-05 NOTE — ED Triage Notes (Signed)
Pt presents to the ED via ems c/o syncope and hypotension. Per ems pt initial bp was 60 systolic and pt family stated pt had "passed out" in the bathroom and was caught before hitting the floor.

## 2023-01-05 NOTE — ED Notes (Signed)
Patient transported to CT 

## 2023-01-05 NOTE — ED Provider Triage Note (Signed)
Emergency Medicine Provider Triage Evaluation Note  Glen Spencer , a 82 y.o. male  was evaluated in triage.  Pt complains of syncope, altered mental status.  Patient with recent constipation, he was in the restroom trying to have a bowel movement when he felt dizzy and began to have a syncopal episode.  Wife caught him so that he did not injure himself. Refused EMS when they came the first time. Had another syncopal episode and called EMS back.  Appeared confused with negative stroke screen.  Initially complained of some abdominal pain.  Initially hypotensive with improvement of blood pressure without intervention.  Review of Systems  Positive: Syncope, confusion Negative: Chest pain, sob  Physical Exam  BP (!) 149/80 (BP Location: Right Arm)   Pulse 74   Temp (!) 97.5 F (36.4 C) (Oral)   Resp 18   Ht 5\' 10"  (1.778 m)   Wt 94.8 kg   SpO2 99%   BMI 29.99 kg/m  Gen:   Awake, no distress   Resp:  Normal effort  MSK:   Moves extremities without difficulty  Other:  Alert and oriented x 2.  CN II-XII grossly intact.  5/5 motor strength and sensation all extremities.  Abdomen nontender.  No abdominal bruits.  Medical Decision Making  Medically screening exam initiated at 6:29 AM.  Appropriate orders placed.  Glen Spencer was informed that the remainder of the evaluation will be completed by another provider, this initial triage assessment does not replace that evaluation, and the importance of remaining in the ED until their evaluation is complete.  82 year old male presenting with syncope and altered mental status.  Will obtain lab work, CT head, CT abdomen/pelvis.  Initiate IV fluid hydration while patient awaits oncoming provider.   Glen Hong, MD 01/05/23 [phone]

## 2023-01-07 NOTE — Group Note (Deleted)

## 2023-02-01 ENCOUNTER — Ambulatory Visit (INDEPENDENT_AMBULATORY_CARE_PROVIDER_SITE_OTHER): Payer: Medicare Other | Admitting: Urology

## 2023-02-01 ENCOUNTER — Ambulatory Visit: Payer: Medicare Other | Admitting: Urology

## 2023-02-01 ENCOUNTER — Other Ambulatory Visit
Admission: RE | Admit: 2023-02-01 | Discharge: 2023-02-01 | Disposition: A | Discharge status: Home / Self Care | Payer: Medicare Other | Attending: Urology | Admitting: Urology

## 2023-02-01 ENCOUNTER — Encounter: Payer: Self-pay | Admitting: Urology

## 2023-02-01 ENCOUNTER — Other Ambulatory Visit: Payer: Self-pay

## 2023-02-01 VITALS — BP 157/89 | HR 61 | Ht 70.0 in | Wt 204.0 lb

## 2023-02-01 DIAGNOSIS — Z87898 Personal history of other specified conditions: Secondary | ICD-10-CM

## 2023-02-01 DIAGNOSIS — N2889 Other specified disorders of kidney and ureter: Secondary | ICD-10-CM | POA: Insufficient documentation

## 2023-02-01 DIAGNOSIS — R338 Other retention of urine: Secondary | ICD-10-CM | POA: Diagnosis not present

## 2023-02-01 DIAGNOSIS — N401 Enlarged prostate with lower urinary tract symptoms: Secondary | ICD-10-CM

## 2023-02-01 LAB — URINALYSIS, COMPLETE (UACMP) WITH MICROSCOPIC
Bilirubin Urine: NEGATIVE
Glucose, UA: NEGATIVE mg/dL
Hgb urine dipstick: NEGATIVE
Ketones, ur: NEGATIVE mg/dL
Leukocytes,Ua: NEGATIVE
Nitrite: NEGATIVE
Protein, ur: NEGATIVE mg/dL
Specific Gravity, Urine: 1.02 (ref 1.005–1.030)
pH: 6.5 (ref 5.0–8.0)

## 2023-02-01 LAB — BLADDER SCAN AMB NON-IMAGING

## 2023-02-01 MED ORDER — FINASTERIDE 5 MG PO TABS: 5.0000 mg | ORAL_TABLET | Freq: Every day | ORAL | 3 refills | Status: DC

## 2023-02-01 NOTE — Progress Notes (Signed)
Marcelle Overlie Plume,acting as a scribe for [name], MD.,have documented all relevant documentation on the behalf of [name], MD,as directed by  [name], MD while in the presence of [name], MD.  02/01/2023 3:48 PM   Glen Spencer [birth year] [id number]  Referring provider: Sharyn Creamer, MD [address] [place],  [place] [address]  Chief Complaint  Patient presents with   renal mass    HPI: 82 year-old male who is known to me and last seen in 2020. He has a personal history of urinary retention, status post HoLEP in 2017.   More recently, he was seen and evaluated in the emergency room on 01/05/2023 after a loss of consciousness and episode of hypotension. He had a CT abdomen pelvis that showed a 1.3 by 1.4 lower pole incidental renal mass on the right concerning for small kidney malignancy. Also he was known to have a severely distended bladder with marked prostatomegaly. Despite having a severely distended bladder, the ultimately elected not to place a Foley catheter and he returns today for follow up. I personally reviewed his scan today and also compared it a the scan in 2017 and in a similar location, there was a lesion posteriorly that is similar to the lesion in question today.   His urinalysis today is negative.  His most recent creatinine was 0.83 in the emergency room. His most recent PSA 2 months ago was 4.53.   He is no longer on any BPH medications, including finasteride. He reports feeling pressure in the lower abdomen, which may be related to bladder fullness. He is advised to attempt urination when feeling pressure to assess if it alleviates the sensation.   Results for orders placed or performed in visit on 02/01/23  Bladder Scan (Post Void Residual) in office  Result Value Ref Range   Scan Result   Results for orders placed or performed during the hospital encounter of 02/01/23  Urinalysis, Complete w Microscopic -  Result Value  Ref Range   Color, Urine YELLOW YELLOW   APPearance CLEAR CLEAR   Specific Gravity, Urine 1.020 1.005 - 1.030   pH 6.5 5.0 - 8.0   Glucose, UA NEGATIVE NEGATIVE mg/dL   Hgb urine dipstick NEGATIVE NEGATIVE   Bilirubin Urine NEGATIVE NEGATIVE   Ketones, ur NEGATIVE NEGATIVE mg/dL   Protein, ur NEGATIVE NEGATIVE mg/dL   Nitrite NEGATIVE NEGATIVE   Leukocytes,Ua NEGATIVE NEGATIVE   Squamous Epithelial / HPF 0-5 0 - 5 /HPF   WBC, UA 0-5 0 - 5 WBC/hpf   RBC / HPF 0-5 0 - 5 RBC/hpf   Bacteria, UA RARE (A) NONE SEEN    IPSS     Row Name 02/01/23 1500         International Prostate Symptom Score   How often have you had the sensation of not emptying your bladder? More than half the time     How often have you had to urinate less than every two hours? Less than 1 in 5 times     How often have you found you stopped and started again several times when you urinated? Not at All     How often have you found it difficult to postpone urination? Not at All     How often have you had a weak urinary stream? Not at All     How often have you had to strain to start urination? Not at All     How many times  did you typically get up at night to urinate? 1 Time     Total IPSS Score 6       Quality of Life due to urinary symptoms   If you were to spend the rest of your life with your urinary condition just the way it is now how would you feel about that? Mostly Satisfied              Score:  1-7 Mild 8-19 Moderate 20-35 Severe    PMH: Past Medical History:  Diagnosis Date   Anemia    Asthma    childhood asthma   Enlarged prostate with urinary retention    GERD (gastroesophageal reflux disease)    Hypertension     Surgical History: Past Surgical History:  Procedure Laterality Date   COLONOSCOPY WITH PROPOFOL N/A 08/08/2015   Procedure: COLONOSCOPY WITH PROPOFOL;  Surgeon: Wallace Cullens, MD;  Location: [facility] ENDOSCOPY;  Service: Gastroenterology;  Laterality: N/A;   CYSTOSCOPY W/  RETROGRADES Bilateral 11/19/2016   Procedure: CYSTOSCOPY WITH RETROGRADE PYELOGRAM;  Surgeon: [name], MD;  Location: [facility] ORS;  Service: Urology;  Laterality: Bilateral;   CYSTOSCOPY WITH LITHOLAPAXY N/A 11/19/2016   Procedure: CYSTOSCOPY WITH LITHOLAPAXY;  Surgeon: [name], MD;  Location: [facility] ORS;  Service: Urology;  Laterality: N/A;   ESOPHAGOGASTRODUODENOSCOPY (EGD) WITH PROPOFOL N/A 08/08/2015   Procedure: ESOPHAGOGASTRODUODENOSCOPY (EGD) WITH PROPOFOL;  Surgeon: Wallace Cullens, MD;  Location: [facility] ENDOSCOPY;  Service: Gastroenterology;  Laterality: N/A;   HERNIA REPAIR Right 1962   Inguinal Hernia   HOLEP-LASER ENUCLEATION OF THE PROSTATE WITH MORCELLATION N/A 03/26/2016   Procedure: HOLEP-LASER ENUCLEATION OF THE PROSTATE WITH MORCELLATION;  Surgeon: [name], MD;  Location: [facility] ORS;  Service: Urology;  Laterality: N/A;   HOLMIUM LASER APPLICATION N/A 03/26/2016   Procedure: HOLMIUM LASER APPLICATION;  Surgeon: [name], MD;  Location: [facility] ORS;  Service: Urology;  Laterality: N/A;   HOLMIUM LASER APPLICATION N/A 11/19/2016   Procedure: HOLMIUM LASER APPLICATION;  Surgeon: [name], MD;  Location: [facility] ORS;  Service: Urology;  Laterality: N/A;    Home Medications:  Allergies as of 02/01/2023   No Known Allergies      Medication List        Accurate as of February 01, 2023  3:48 PM. If you have any questions, ask your nurse or doctor.          STOP taking these medications    ferrous sulfate 325 (65 FE) MG tablet Stopped by: [name]   fluticasone 50 MCG/ACT nasal spray Commonly known as: FLONASE Stopped by: [name]   pantoprazole 40 MG tablet Commonly known as: PROTONIX Stopped by: [name]   sildenafil 20 MG tablet Commonly known as: Revatio Stopped by: [name]       TAKE these medications    potassium chloride 10 MEQ tablet Commonly known as: KLOR-CON Take 10 mEq by mouth 2 (two) times daily. What  changed: Another medication with the same name was removed. Continue taking this medication, and follow the directions you see here. Changed by: [name]   valsartan-hydrochlorothiazide 160-25 MG tablet Commonly known as: DIOVAN-HCT Take 1 tablet by mouth daily.   Vitamin D-1000 Max St 25 MCG (1000 UT) tablet Generic drug: Cholecalciferol Take 1,000 Units by mouth daily.        Family History: Family History  Problem Relation Age of Onset   Prostate cancer Neg Hx    Bladder Cancer Neg Hx    Kidney  cancer Neg Hx     Social History:  reports that he has never smoked. He has never used smokeless tobacco. He reports that he does not drink alcohol and does not use drugs.   Physical Exam: BP (!) 157/89 (BP Location: Left Arm, Patient Position: Sitting, Cuff Size: Normal)   Pulse 61   Ht 5\' 10"  (1.778 m)   Wt 204 lb (92.5 kg)   BMI 29.27 kg/m   Constitutional:  Alert and oriented, No acute distress. HEENT: [place] AT, moist mucus membranes.  Trachea midline, no masses. Neurologic: Grossly intact, no focal deficits, moving all 4 extremities. Psychiatric: Normal mood and affect.  Pertinent Imaging: EXAM: CT ABDOMEN AND PELVIS WITH CONTRAST   TECHNIQUE: Multidetector CT imaging of the abdomen and pelvis was performed using the standard protocol following bolus administration of intravenous contrast.   RADIATION DOSE REDUCTION: This exam was performed according to the departmental dose-optimization program which includes automated exposure control, adjustment of the mA and/or kV according to patient size and/or use of iterative reconstruction technique.   CONTRAST:  OMNIPAQUE IOHEXOL 300 MG/ML  SOLN   COMPARISON:  CT of the abdomen and pelvis 02/12/2016.   FINDINGS: Lower chest: Atherosclerotic calcifications in the descending thoracic aorta. Small hiatal hernia.   Hepatobiliary: Subcentimeter low-attenuation lesion in segment 3 of the liver, too small to  characterize, but statistically likely a tiny cyst or biliary hamartoma (no imaging follow-up recommended). No other aggressive appearing hepatic lesions are noted. No intra or extrahepatic biliary ductal dilatation. Gallbladder is unremarkable in appearance.   Pancreas: No pancreatic mass. No pancreatic ductal dilatation. No pancreatic or peripancreatic fluid collections or inflammatory changes.   Spleen: Unremarkable.   Adrenals/Urinary Tract: 1.4 cm low-attenuation lesion in the lower pole of the right kidney is compatible with a simple (Bosniak class 1) cyst which requires no imaging follow-up. Subtle lesion in the lower pole of the right kidney (axial image 29 of series 7) measuring 1.4 x 1.3 cm, concerning for potential enhancing renal neoplasm. Left kidney and bilateral adrenal glands are otherwise normal in appearance. No hydroureteronephrosis. Urinary bladder is severely distended measuring up to 11.3 x 7.3 x 17.3 cm (estimated bladder volume of 714 mL). Several small bladder diverticuli and bladder trabeculations are noted.   Stomach/Bowel: The appearance of the stomach is unremarkable. No pathologic dilatation of small bowel or colon. A few scattered colonic diverticuli are noted, without definite focal surrounding inflammatory changes to indicate an acute diverticulitis at this time. Normal appendix.   Vascular/Lymphatic: Atherosclerosis in the abdominal aorta and pelvic vasculature. No lymphadenopathy noted in the abdomen or pelvis.   Reproductive: Severe prostatomegaly. Prostate measures approximately 8.0 x 7.4 x 8.3 cm and is markedly irregular in contour in appearance.   Other: No significant volume of ascites.  No pneumoperitoneum.   Musculoskeletal: There are no aggressive appearing lytic or blastic lesions noted in the visualized portions of the skeleton.   IMPRESSION: 1. Severely distended urinary bladder. Marked prostatomegaly. Clinical correlation for  signs and symptoms of bladder outlet obstruction is recommended. 2. Colonic diverticulosis without evidence of acute diverticulitis at this time. 3. 1.3 x 1.4 cm lesion in the lower pole of the right kidney concerning for potential neoplasm. Further evaluation with nonemergent outpatient abdominal MRI with and without IV gadolinium is recommended in the near future to provide more definitive characterization and exclude neoplasm. 4. Aortic atherosclerosis. 5. Small hiatal hernia. 6. Additional incidental findings, as above.     Electronically Signed  By: Trudie Reed M.D.   On: 01/05/2023 08:31  This was personally reviewed and I agree with the radiologic interpretation.   EXAM: CT ABDOMEN AND PELVIS WITHOUT CONTRAST   TECHNIQUE: Multidetector CT imaging of the abdomen and pelvis was performed following the standard protocol without IV contrast.   COMPARISON:  03/04/2013 CT   FINDINGS: Please note that parenchymal abnormalities may be missed without intravenous contrast.   Lower chest: No acute abnormality.   Hepatobiliary: The liver and gallbladder are unremarkable. There is no evidence of biliary dilatation.   Pancreas: Unremarkable   Spleen: Unremarkable   Adrenals/Urinary Tract: Marked bladder distention identified with mild to moderate bilateral hydronephrosis. Mild circumferential bladder wall thickening with trabeculation noted. No urinary calculi are identified.   The adrenal glands are unremarkable.   Stomach/Bowel: There is no evidence of bowel obstruction or definite bowel wall thickening. The appendix is normal.   Vascular/Lymphatic: No significant vascular findings are present. No enlarged abdominal or pelvic lymph nodes.   Reproductive: Marked prostate enlargement noted.   Other: No free fluid, focal collection or pneumoperitoneum.   Musculoskeletal: No acute or significant osseous findings.   IMPRESSION: Marked bladder distention with  mild to moderate bilateral hydronephrosis. This is compatible with bladder outlet obstruction of which may be caused by marked prostate enlargement.   No other acute or significant abnormality.     Electronically Signed   By: Harmon Pier M.D.   On: 02/12/2016 13:31   This was personally reviewed and I agree with the radiologic interpretation.   Assessment & Plan:    1. BPH with urinary retention/incomplete bladder emptying - Restart finasteride to manage prostate size.  - Monitor bladder function and fullness.  - Follow up in three months with a bladder scan to ensure no worsening of urinary retention.  -PVR is elevated today but his bladder is less distended than when he was in the emergency room.  He is comfortable, no issues with infection, and his renal function is preserved, with most recent Cr of 0.83; this is likely recommending self catheter Foley at this time  2. Renal mass,  - Incidental finding, small and incompletely characterized.  We discussed small renal masses in detail.  Retrospectively, it does appear that this mass may have been present in the past as well. -Differential diagnosis was reviewed.  This may in fact represent a small renal cell carcinoma but these tend to be fairly slow-growing managed conservatively.  Alternatives include surgery versus cryoablation.  Risk of interval development of metastatic disease is minimal at current size. - Given his age and comorbidities, I would likely recommend a more conservative approach and plan a CT scan again in 6 months -Both he and his wife are agreeable with this plan.  Return in about 3 months (around 05/04/2023) for repeat PVR. 6 months for repeat CT abdomen pelvis.  [facility] [address], [address] [place], [place] [address] [phone]

## 2023-05-07 ENCOUNTER — Encounter: Payer: Self-pay | Admitting: Urology

## 2023-05-07 ENCOUNTER — Ambulatory Visit: Payer: Medicare Other | Admitting: Urology

## 2023-05-07 VITALS — BP 160/93 | HR 64 | Ht 70.0 in | Wt 204.6 lb

## 2023-05-07 DIAGNOSIS — R338 Other retention of urine: Secondary | ICD-10-CM

## 2023-05-07 DIAGNOSIS — N2889 Other specified disorders of kidney and ureter: Secondary | ICD-10-CM | POA: Diagnosis not present

## 2023-05-07 DIAGNOSIS — N401 Enlarged prostate with lower urinary tract symptoms: Secondary | ICD-10-CM

## 2023-05-07 DIAGNOSIS — K5900 Constipation, unspecified: Secondary | ICD-10-CM

## 2023-05-07 DIAGNOSIS — Z87898 Personal history of other specified conditions: Secondary | ICD-10-CM

## 2023-05-07 DIAGNOSIS — R339 Retention of urine, unspecified: Secondary | ICD-10-CM

## 2023-05-07 LAB — BLADDER SCAN AMB NON-IMAGING: PVR: 285 WU

## 2023-05-07 NOTE — Progress Notes (Signed)
 I,Amy L Pierron,acting as a scribe for Rosina Riis, MD.,have documented all relevant documentation on the behalf of Rosina Riis, MD,as directed by  Rosina Riis, MD while in the presence of Rosina Riis, MD.  05/07/2023 2:47 PM   Glen Spencer [birth year] [id number]  Referring provider: Glover Lenis, MD [phone] [place],  [place] [address]  Chief Complaint  Patient presents with   Benign Prostatic Hypertrophy    HPI: 83 year-old male presents today for a follow-up.  He has multiple GU issues including personal history of urinary retention status post HoLep in 2017.  He also has an incidental small renal mass measuring 1.4 centimeters on the right kidney which may have been present chronically for which he reestablished care.   At last visit he wasn't emptying his bladder completely but he was otherwise asymptomatic and off of meds. He was restarted on Finasteride  in the setting of incomplete bladder emptying.  He has been taking the Finasteride  since last visit.   He reports having issues with constipation recently.   Results for orders placed or performed in visit on 05/07/23  Bladder Scan (Post Void Residual) in office  Result Value Ref Range   PVR 285.0 WU    PMH: Past Medical History:  Diagnosis Date   Anemia    Asthma    childhood asthma   Enlarged prostate with urinary retention    GERD (gastroesophageal reflux disease)    Hypertension     Surgical History: Past Surgical History:  Procedure Laterality Date   COLONOSCOPY WITH PROPOFOL  N/A 08/08/2015   Procedure: COLONOSCOPY WITH PROPOFOL ;  Surgeon: Deward CINDERELLA Piedmont, MD;  Location: [facility] ENDOSCOPY;  Service: Gastroenterology;  Laterality: N/A;   CYSTOSCOPY W/ RETROGRADES Bilateral 11/19/2016   Procedure: CYSTOSCOPY WITH RETROGRADE PYELOGRAM;  Surgeon: Riis Rosina, MD;  Location: [facility] ORS;  Service: Urology;  Laterality: Bilateral;   CYSTOSCOPY WITH LITHOLAPAXY N/A 11/19/2016   Procedure:  CYSTOSCOPY WITH LITHOLAPAXY;  Surgeon: Riis Rosina, MD;  Location: [facility] ORS;  Service: Urology;  Laterality: N/A;   ESOPHAGOGASTRODUODENOSCOPY (EGD) WITH PROPOFOL  N/A 08/08/2015   Procedure: ESOPHAGOGASTRODUODENOSCOPY (EGD) WITH PROPOFOL ;  Surgeon: Deward CINDERELLA Piedmont, MD;  Location: [facility] ENDOSCOPY;  Service: Gastroenterology;  Laterality: N/A;   HERNIA REPAIR Right 1962   Inguinal Hernia   HOLEP-LASER ENUCLEATION OF THE PROSTATE WITH MORCELLATION N/A 03/26/2016   Procedure: HOLEP-LASER ENUCLEATION OF THE PROSTATE WITH MORCELLATION;  Surgeon: Rosina Riis, MD;  Location: [facility] ORS;  Service: Urology;  Laterality: N/A;   HOLMIUM LASER APPLICATION N/A 03/26/2016   Procedure: HOLMIUM LASER APPLICATION;  Surgeon: Rosina Riis, MD;  Location: [facility] ORS;  Service: Urology;  Laterality: N/A;   HOLMIUM LASER APPLICATION N/A 11/19/2016   Procedure: HOLMIUM LASER APPLICATION;  Surgeon: Riis Rosina, MD;  Location: [facility] ORS;  Service: Urology;  Laterality: N/A;    Home Medications:  Allergies as of 05/07/2023   No Known Allergies      Medication List        Accurate as of May 07, 2023  2:47 PM. If you have any questions, ask your nurse or doctor.          finasteride  5 MG tablet Commonly known as: PROSCAR  Take 1 tablet (5 mg total) by mouth daily.   potassium chloride  10 MEQ tablet Commonly known as: KLOR-CON  Take 10 mEq by mouth 2 (two) times daily.   valsartan-hydrochlorothiazide 160-25 MG tablet Commonly known as: DIOVAN-HCT Take 1 tablet by mouth daily.   Vitamin D-1000 Max St 25  MCG (1000 UT) tablet Generic drug: Cholecalciferol Take 1,000 Units by mouth daily.        Family History: Family History  Problem Relation Age of Onset   Prostate cancer Neg Hx    Bladder Cancer Neg Hx    Kidney cancer Neg Hx     Social History:  reports that he has never smoked. He has never used smokeless tobacco. He reports that he does not drink alcohol and does not use  drugs.   Physical Exam: BP (!) 160/93   Pulse 64   Ht 5' 10 (1.778 m)   Wt 204 lb 9.6 oz (92.8 kg)   BMI 29.36 kg/m   Constitutional:  Alert and oriented, No acute distress.  Accompanied by wife. HEENT: [place] AT, moist mucus membranes.  Trachea midline, no masses. Neurologic: Grossly intact, no focal deficits, moving all 4 extremities. Psychiatric: Normal mood and affect.   Assessment & Plan:    1. Incomplete bladder emptying  - PVR today is 285.  -Bladder emptying is stable, not improved but is fairly asymptomatic. -Will plan to continue finasteride  to optimize emptying  2. Renal mass - Plan for next CT in March of this year. Will call him with the results. If mass is unchanged and still without symptoms plan to return next year for office visit.  3. Constipation  - Recommended using MiraLax. Okay to take with Colace twice daily.   Return in about 1 year (around 05/06/2024). For MR abd  I have reviewed the above documentation for accuracy and completeness, and I agree with the above.   Rosina Riis, MD    [facility] [address], [address] [place], [place] [address] [phone]

## 2023-07-30 ENCOUNTER — Telehealth: Payer: Self-pay | Admitting: Urology

## 2023-07-30 DIAGNOSIS — N2889 Other specified disorders of kidney and ureter: Secondary | ICD-10-CM

## 2023-07-30 NOTE — Telephone Encounter (Signed)
 Per Dr Delana Meyer last office note 05/2023, pt is supposed to have a CT scan March of this year.  I didn't see order.  I told pt's wife I would send message and scheduling should reach out.

## 2023-07-30 NOTE — Telephone Encounter (Signed)
 Will let Dr Apolinar Junes review to make sure correct CT scan gets ordered.

## 2023-08-06 NOTE — Telephone Encounter (Signed)
 Order has been placed.

## 2023-08-06 NOTE — Addendum Note (Signed)
 Addended by: Consuella Lose on: 08/06/2023 09:17 AM   Modules accepted: Orders

## 2023-08-22 ENCOUNTER — Ambulatory Visit
Admission: RE | Admit: 2023-08-22 | Discharge: 2023-08-22 | Disposition: A | Discharge status: Home / Self Care | Source: Ambulatory Visit | Attending: Urology | Admitting: Urology

## 2023-08-22 DIAGNOSIS — N2889 Other specified disorders of kidney and ureter: Secondary | ICD-10-CM | POA: Insufficient documentation

## 2023-08-22 MED ORDER — IOHEXOL 300 MG/ML  SOLN
100.0000 mL | Freq: Once | INTRAMUSCULAR | Status: AC | PRN
  Administered 2023-08-22: 100 mL via INTRAVENOUS

## 2023-10-16 ENCOUNTER — Ambulatory Visit (INDEPENDENT_AMBULATORY_CARE_PROVIDER_SITE_OTHER): Payer: Self-pay | Admitting: Urology

## 2023-10-16 VITALS — BP 159/89 | HR 65 | Ht 70.0 in | Wt 202.0 lb

## 2023-10-16 DIAGNOSIS — R338 Other retention of urine: Secondary | ICD-10-CM

## 2023-10-16 DIAGNOSIS — N2889 Other specified disorders of kidney and ureter: Secondary | ICD-10-CM | POA: Diagnosis not present

## 2023-10-16 DIAGNOSIS — N401 Enlarged prostate with lower urinary tract symptoms: Secondary | ICD-10-CM

## 2023-10-16 NOTE — Progress Notes (Signed)
 Elfrieda Grise Plume,acting as a scribe for Glen Gimenez, MD.,have documented all relevant documentation on the behalf of Glen Gimenez, MD,as directed by  Glen Gimenez, MD while in the presence of Glen Gimenez, MD.  10/16/23 2:09 PM   Kermit Ped [birth year] [id number]  Referring provider: Rory Collard, MD [phone] 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   He continues on Finasteride  and has mildly elevated PVR, although it is markedly improved from his previous baseline.   He was found to have an incidental 1.4 cm right renal mass, which was followed up with a CT abdomen without contrast on 08-22-23. The lesion now measures 1.7 by 1.5 cm, previously 1.3 by 1.5 cm, indicating a two-millimeter growth over nine months. He is slightly overdue for follow-up. This imaging was personally reviewed today and noted minimal enhancement of the lesion.  He is considering continued surveillance to assess the growth rate over one additional interval, with consideration for ablative therapy if growth exceeds three millimeters.   He reports no significant urinary symptoms and is comfortable with his current urinary status.   He also mentions a history of hepatic cysts, which the provider reassures are common and non-enhancing, indicating no immediate concern.   He has a family history of stomach issues, with his brother, a doctor and urologist, having passed away, and his father having had stomach issues. He had surgery in January for a hiatal hernia, which was causing symptoms due to organ sliding.    PMH: Past Medical History:  Diagnosis Date   Anemia    Asthma    childhood asthma   Enlarged prostate with urinary retention    GERD  (gastroesophageal reflux disease)    Hypertension     Surgical History: Past Surgical History:  Procedure Laterality Date   COLONOSCOPY WITH PROPOFOL  N/A 08/08/2015   Procedure: COLONOSCOPY WITH PROPOFOL ;  Surgeon: Stephens Eis, MD;  Location: [facility];  Service: Gastroenterology;  Laterality: N/A;   CYSTOSCOPY W/ RETROGRADES Bilateral 11/19/2016   Procedure: CYSTOSCOPY WITH RETROGRADE PYELOGRAM;  Surgeon: Glen Gimenez, MD;  Location: [facility];  Service: Urology;  Laterality: Bilateral;   CYSTOSCOPY WITH LITHOLAPAXY N/A 11/19/2016   Procedure: CYSTOSCOPY WITH LITHOLAPAXY;  Surgeon: Glen Gimenez, MD;  Location: [facility];  Service: Urology;  Laterality: N/A;   ESOPHAGOGASTRODUODENOSCOPY (EGD) WITH PROPOFOL  N/A 08/08/2015   Procedure: ESOPHAGOGASTRODUODENOSCOPY (EGD) WITH PROPOFOL ;  Surgeon: Stephens Eis, MD;  Location: [facility];  Service: Gastroenterology;  Laterality: N/A;   HERNIA REPAIR Right 1962   Inguinal Hernia   HOLEP-LASER ENUCLEATION OF THE PROSTATE WITH MORCELLATION N/A 03/26/2016   Procedure: HOLEP-LASER ENUCLEATION OF THE PROSTATE WITH MORCELLATION;  Surgeon: Glen Gimenez, MD;  Location: [facility];  Service: Urology;  Laterality: N/A;   HOLMIUM LASER APPLICATION N/A 03/26/2016   Procedure: HOLMIUM LASER APPLICATION;  Surgeon: Glen Gimenez, MD;  Location: [facility];  Service: Urology;  Laterality: N/A;   HOLMIUM LASER APPLICATION N/A 11/19/2016   Procedure: HOLMIUM LASER APPLICATION;  Surgeon: Glen Gimenez, MD;  Location: [facility];  Service: Urology;  Laterality: N/A;    Home Medications:  Allergies as of 10/16/2023   No Known Allergies      Medication List        Accurate  as of October 16, 2023  2:09 PM. If you have any questions, ask your nurse or doctor.          finasteride  5 MG tablet Commonly known as: PROSCAR  Take 1 tablet (5 mg total) by mouth daily.   potassium chloride  10 MEQ tablet Commonly known as: KLOR-CON  Take 10 mEq by mouth 2 (two)  times daily.   valsartan-hydrochlorothiazide 160-25 MG tablet Commonly known as: DIOVAN-HCT Take 1 tablet by mouth daily.   Vitamin D-1000 Max St 25 MCG (1000 UT) tablet Generic drug: Cholecalciferol Take 1,000 Units by mouth daily.        Family History: Family History  Problem Relation Age of Onset   Prostate cancer Neg Hx    Bladder Cancer Neg Hx    Kidney cancer Neg Hx     Social History:  reports that he has never smoked. He has never used smokeless tobacco. He reports that he does not drink alcohol and does not use drugs.   Physical Exam: BP (!) 159/89   Pulse 65   Ht 5' 10 (1.778 m)   Wt 202 lb (91.6 kg)   BMI 28.98 kg/m   Constitutional:  Alert and oriented, No acute distress. HEENT: [place] AT, moist mucus membranes.  Trachea midline, no masses. Neurologic: Grossly intact, no focal deficits, moving all 4 extremities. Psychiatric: Normal mood and affect.  Pertinent Imaging: EXAM: CT ABDOMEN WITHOUT AND WITH CONTRAST   TECHNIQUE: Multidetector CT imaging of the abdomen was performed following the standard protocol before and following the bolus administration of intravenous contrast.   RADIATION DOSE REDUCTION: This exam was performed according to the departmental dose-optimization program which includes automated exposure control, adjustment of the mA and/or kV according to patient size and/or use of iterative reconstruction technique.   CONTRAST:  OMNIPAQUE  IOHEXOL  300 MG/ML  SOLN   COMPARISON:  01/05/2023   FINDINGS: Lower chest: Right base scarring. Normal heart size without pericardial or pleural effusion. Tiny hiatal hernia.   Hepatobiliary: Segment 3 subcentimeter hepatic cyst. Normal gallbladder, without biliary ductal dilatation.   Pancreas: Normal, without mass or ductal dilatation.   Spleen: Normal in size, without focal abnormality.   Adrenals/Urinary Tract: Bilateral adrenal thickening. No renal calculi or hydronephrosis.    Interpolar right renal hypoattenuating 1.0 cm lesion is borderline too small to characterize, but well-circumscribed and favored to represent a cyst. No suspicious left renal lesion.   Lower pole right renal lesion measures 1.7 x 1.5 cm on 84/8 and demonstrates heterogeneous post-contrast enhancement. Compare 1.3 x 1.5 cm on the prior exam (when remeasured).   No hydronephrosis.   Stomach/Bowel: Normal remainder of the stomach. Scattered colonic diverticula. Colonic stool burden suggests constipation. Normal small bowel.   Vascular/Lymphatic: Aortic atherosclerosis. Patent renal veins. Accessory lower pole right renal artery including on 54/8.   No retroperitoneal or retrocrural adenopathy.   Other: No ascites.  Small fat containing paraumbilical hernia.   Musculoskeletal: No acute osseous abnormality.   IMPRESSION: 1. Minimal enlargement of a 1.7 cm lower pole right renal enhancing lesion, most consistent with renal cell carcinoma. No evidence of right renal vein involvement or abdominal metastatic disease. 2. Incidental findings, including: Tiny hiatal hernia. Possible constipation. Aortic Atherosclerosis (ICD10-I70.0).     Electronically Signed   By: Lore Rode M.D.   On: 09/05/2023 08:34  This was personally reviewed and I agree with the radiologic interpretation.  Assessment & Plan:    1. Incidental Renal Mass - The renal mass has  increased slightly in size from 1.3 x 1.5 cm to 1.7 x 1.5 cm over nine months, indicating a growth rate of approximately 2 mm. This is below the generally accepted threshold of 3-4 mm annually for concern.  - Continued surveillance is recommended, with a follow-up CT scan of the abdomen with and without contrast in six months to better assess the growth rate over an extended period. If growth exceeds 3 mm, consider ablative therapy. - Both he and his wife strongly prefer this option  2. BPH with LUTS - Symptoms are well controlled on  Finasteride . - Continue current medication regimen.  3. Hepatic Cysts - Incidental finding of hepatic cysts, which are non-enhancing and common.  - No further action required unless symptomatic.  Return in about 6 months (around 04/16/2024) for CT abdomen WWO contrast to reassess renal mass. Follow up with Dr. Cherylene Corrente or Dr. Estanislao Heimlich.  I have reviewed the above documentation for accuracy and completeness, and I agree with the above.   Glen Gimenez, MD  [facility] [address] [place], [place] [address] [phone]

## 2024-02-20 ENCOUNTER — Other Ambulatory Visit: Payer: Self-pay

## 2024-02-20 DIAGNOSIS — Z87898 Personal history of other specified conditions: Secondary | ICD-10-CM

## 2024-02-20 DIAGNOSIS — N401 Enlarged prostate with lower urinary tract symptoms: Secondary | ICD-10-CM

## 2024-02-20 MED ORDER — FINASTERIDE 5 MG PO TABS: 5.0000 mg | ORAL_TABLET | Freq: Every day | ORAL | 3 refills | Status: DC

## 2024-03-12 ENCOUNTER — Encounter: Payer: Self-pay | Admitting: *Deleted

## 2024-03-12 ENCOUNTER — Other Ambulatory Visit: Payer: Self-pay | Admitting: *Deleted

## 2024-03-12 DIAGNOSIS — N2889 Other specified disorders of kidney and ureter: Secondary | ICD-10-CM

## 2024-03-14 ENCOUNTER — Ambulatory Visit (HOSPITAL_BASED_OUTPATIENT_CLINIC_OR_DEPARTMENT_OTHER)
Admission: RE | Admit: 2024-03-14 | Discharge: 2024-03-14 | Disposition: A | Source: Ambulatory Visit | Attending: Urology | Admitting: Urology

## 2024-03-14 DIAGNOSIS — N2889 Other specified disorders of kidney and ureter: Secondary | ICD-10-CM | POA: Insufficient documentation

## 2024-03-14 LAB — POCT I-STAT CREATININE: Creatinine, Ser: 0.9 mg/dL (ref 0.61–1.24)

## 2024-03-14 MED ORDER — IOHEXOL 300 MG/ML  SOLN
100.0000 mL | Freq: Once | INTRAMUSCULAR | Status: AC | PRN
  Administered 2024-03-14: 100 mL via INTRAVENOUS

## 2024-03-31 ENCOUNTER — Ambulatory Visit: Admitting: Urology

## 2024-04-13 NOTE — Addendum Note (Signed)
 Addended by: HELON KIRSCH A on: 04/13/2024 04:42 PM   Modules accepted: Orders

## 2024-04-20 ENCOUNTER — Ambulatory Visit: Admitting: Urology

## 2024-05-04 ENCOUNTER — Encounter: Payer: Self-pay | Admitting: Urology

## 2024-05-04 ENCOUNTER — Ambulatory Visit (INDEPENDENT_AMBULATORY_CARE_PROVIDER_SITE_OTHER): Admitting: Urology

## 2024-05-04 VITALS — BP 135/89 | HR 79 | Ht 70.0 in | Wt 202.0 lb

## 2024-05-04 DIAGNOSIS — R338 Other retention of urine: Secondary | ICD-10-CM | POA: Diagnosis not present

## 2024-05-04 DIAGNOSIS — Z87898 Personal history of other specified conditions: Secondary | ICD-10-CM | POA: Diagnosis not present

## 2024-05-04 DIAGNOSIS — N2889 Other specified disorders of kidney and ureter: Secondary | ICD-10-CM | POA: Diagnosis not present

## 2024-05-04 DIAGNOSIS — N401 Enlarged prostate with lower urinary tract symptoms: Secondary | ICD-10-CM | POA: Diagnosis not present

## 2024-05-04 MED ORDER — FINASTERIDE 5 MG PO TABS: 5.0000 mg | ORAL_TABLET | Freq: Every day | ORAL | 3 refills | Status: AC

## 2024-05-04 NOTE — Progress Notes (Signed)
 "  05/04/2024 2:14 PM   Glen Spencer 1941-02-16 [id number]  Referring provider: Glover Lenis, MD [phone] [place],  [place] [address]  Chief Complaint  Patient presents with   Results   Urologic history: 1.  BPH with urinary retention HoLEP with Dr. Penne 11/19/2016 On finasteride   2.  Right renal mass 1.5 x 1.3 cm right lower pole renal mass incidentally noted on CT 01/05/2023 Slight increase size CT 08/22/23 at 1.7 x 1.5 Elected surveillance  HPI: Glen Spencer is a 84 y.o. male who presents for 75-month follow-up.  His wife was with him today.  No complaints since last visit No flank, abdominal, pelvic pain Denies gross hematuria CT 03/14/2024 shows a 1.7 x 1.3 cm enhancing mass   PMH: Past Medical History:  Diagnosis Date   Anemia    Asthma    childhood asthma   Enlarged prostate with urinary retention    GERD (gastroesophageal reflux disease)    Hypertension     Surgical History: Past Surgical History:  Procedure Laterality Date   COLONOSCOPY WITH PROPOFOL  N/A 08/08/2015   Procedure: COLONOSCOPY WITH PROPOFOL ;  Surgeon: Deward CINDERELLA Piedmont, MD;  Location: [facility];  Service: Gastroenterology;  Laterality: N/A;   CYSTOSCOPY W/ RETROGRADES Bilateral 11/19/2016   Procedure: CYSTOSCOPY WITH RETROGRADE PYELOGRAM;  Surgeon: Penne Knee, MD;  Location: [facility];  Service: Urology;  Laterality: Bilateral;   CYSTOSCOPY WITH LITHOLAPAXY N/A 11/19/2016   Procedure: CYSTOSCOPY WITH LITHOLAPAXY;  Surgeon: Penne Knee, MD;  Location: [facility];  Service: Urology;  Laterality: N/A;   ESOPHAGOGASTRODUODENOSCOPY (EGD) WITH PROPOFOL  N/A 08/08/2015   Procedure: ESOPHAGOGASTRODUODENOSCOPY (EGD) WITH PROPOFOL ;  Surgeon: Deward CINDERELLA Piedmont, MD;  Location: [facility];  Service: Gastroenterology;  Laterality: N/A;   HERNIA REPAIR Right 1962   Inguinal Hernia   HOLEP-LASER ENUCLEATION OF THE PROSTATE WITH MORCELLATION N/A 03/26/2016   Procedure: HOLEP-LASER ENUCLEATION OF  THE PROSTATE WITH MORCELLATION;  Surgeon: Knee Penne, MD;  Location: [facility];  Service: Urology;  Laterality: N/A;   HOLMIUM LASER APPLICATION N/A 03/26/2016   Procedure: HOLMIUM LASER APPLICATION;  Surgeon: Knee Penne, MD;  Location: [facility];  Service: Urology;  Laterality: N/A;   HOLMIUM LASER APPLICATION N/A 11/19/2016   Procedure: HOLMIUM LASER APPLICATION;  Surgeon: Penne Knee, MD;  Location: [facility];  Service: Urology;  Laterality: N/A;    Home Medications:  Allergies as of 05/04/2024   No Known Allergies      Medication List        Accurate as of May 04, 2024  2:14 PM. If you have any questions, ask your nurse or doctor.          finasteride  5 MG tablet Commonly known as: PROSCAR  Take 1 tablet (5 mg total) by mouth daily.   potassium chloride  10 MEQ tablet Commonly known as: KLOR-CON  Take 10 mEq by mouth 2 (two) times daily.   valsartan-hydrochlorothiazide 160-25 MG tablet Commonly known as: DIOVAN-HCT Take 1 tablet by mouth daily.   Vitamin D-1000 Max St 25 MCG (1000 UT) tablet Generic drug: Cholecalciferol Take 1,000 Units by mouth daily.        Allergies: Allergies[1]  Family History: Family History  Problem Relation Age of Onset   Prostate cancer Neg Hx    Bladder Cancer Neg Hx    Kidney cancer Neg Hx     Social History:  reports that he has never smoked. He has never used smokeless tobacco. He reports that he does not drink alcohol and  does not use drugs.   Physical Exam: BP 135/89   Pulse 79   Ht 5' 10 (1.778 m)   Wt 202 lb (91.6 kg)   BMI 28.98 kg/m   Constitutional:  Alert, No acute distress. HEENT: [place] AT Respiratory: Normal respiratory effort, no increased work of breathing. Psychiatric: Normal mood and affect.    Pertinent Imaging: CT images were personally reviewed and interpreted  Assessment & Plan:    1.  Right renal mass We discussed there is a an approximately 70% chance this is a small renal cell  carcinoma No significant interval change since last CT We again discussed options of active surveillance, surgical excision, percutaneous ablation and renal mass biopsy He desires to continue surveillance and will schedule a follow-up CT in 9 months  2.  BPH with LUTS Status post HoLEP Will continue finasteride    Glendia JAYSON Barba, MD  [facility] [address], [address] [place], [place] [address] [phone]     [1] No Known Allergies  "

## 2024-05-04 NOTE — Patient Instructions (Signed)
 Scheduling number: [phone]

## 2024-05-05 ENCOUNTER — Encounter: Payer: Self-pay | Admitting: Urology

## 2024-05-27 ENCOUNTER — Ambulatory Visit: Payer: PRIVATE HEALTH INSURANCE | Admitting: Podiatry

## 2024-05-27 ENCOUNTER — Encounter: Payer: Self-pay | Admitting: Podiatry

## 2024-05-27 DIAGNOSIS — L600 Ingrowing nail: Secondary | ICD-10-CM | POA: Diagnosis not present

## 2024-05-27 NOTE — Progress Notes (Signed)
 Subjective:   Patient ID: Glen Spencer, male   DOB: 84 y.o.   MRN: [id number]   HPI Patient presents with painful ingrown toenail of the left big toe medial border.  States that it did very well for a number of years but it started to become bothersome for him at this time.  Presents with caregiver does not smoke likes to be active   Review of Systems  All other systems reviewed and are negative.       Objective:  Physical Exam Vitals and nursing note reviewed.  Constitutional:      Appearance: He is well-developed.  Pulmonary:     Effort: Pulmonary effort is normal.  Musculoskeletal:        General: Normal range of motion.  Skin:    General: Skin is warm.  Neurological:     Mental Status: He is alert.     Neurovascular status found to be intact muscle strength found to be adequate range of motion adequate with patient noted to have incurvated medial border left hallux painful when pressed crusted tissue formation.  Good digital perfusion well-oriented x 3     Assessment:  Ingrown toenail deformity left hallux medial border with pain     Plan:  H&P reviewed recommended correction of deformity explained procedure risk and after review patient signed consent form.  Infiltrated the left big toe 60 mg like Marcaine mixture sterile prep done using sterile instrumentation remove the border exposed matrix applied phenol 3 applications 30 seconds followed by alcohol lavage sterile dressing gave instructions on soaks wear dressing 24 hours take it off earlier if throbbing were to occur and call with questions concerns

## 2024-05-27 NOTE — Patient Instructions (Signed)

## 2024-08-14 ENCOUNTER — Ambulatory Visit: Admitting: Podiatry

## 2024-12-10 ENCOUNTER — Other Ambulatory Visit (HOSPITAL_BASED_OUTPATIENT_CLINIC_OR_DEPARTMENT_OTHER)
